# Patient Record
Sex: Female | Born: 1965 | Race: White | Hispanic: No | Marital: Married | Smoking: Never smoker
Health system: Southern US, Community
[De-identification: ages and names within clinical notes are randomized; demographics above are authoritative.]

## PROBLEM LIST (undated history)

## (undated) DIAGNOSIS — R51 Headache: Secondary | ICD-10-CM

## (undated) DIAGNOSIS — I10 Essential (primary) hypertension: Secondary | ICD-10-CM

## (undated) DIAGNOSIS — H5500 Unspecified nystagmus: Secondary | ICD-10-CM

## (undated) DIAGNOSIS — F329 Major depressive disorder, single episode, unspecified: Secondary | ICD-10-CM

## (undated) DIAGNOSIS — F32A Depression, unspecified: Secondary | ICD-10-CM

## (undated) DIAGNOSIS — T7840XA Allergy, unspecified, initial encounter: Secondary | ICD-10-CM

## (undated) DIAGNOSIS — G43909 Migraine, unspecified, not intractable, without status migrainosus: Secondary | ICD-10-CM

## (undated) DIAGNOSIS — F419 Anxiety disorder, unspecified: Secondary | ICD-10-CM

## (undated) DIAGNOSIS — Z8489 Family history of other specified conditions: Secondary | ICD-10-CM

## (undated) DIAGNOSIS — H5440 Blindness, one eye, unspecified eye: Secondary | ICD-10-CM

## (undated) DIAGNOSIS — K8020 Calculus of gallbladder without cholecystitis without obstruction: Secondary | ICD-10-CM

## (undated) DIAGNOSIS — Z8632 Personal history of gestational diabetes: Secondary | ICD-10-CM

## (undated) HISTORY — DX: Anxiety disorder, unspecified: F41.9

## (undated) HISTORY — DX: Headache: R51

## (undated) HISTORY — DX: Unspecified nystagmus: H55.00

## (undated) HISTORY — DX: Essential (primary) hypertension: I10

## (undated) HISTORY — DX: Depression, unspecified: F32.A

## (undated) HISTORY — DX: Allergy, unspecified, initial encounter: T78.40XA

## (undated) HISTORY — DX: Major depressive disorder, single episode, unspecified: F32.9

---
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
## 1993-05-23 HISTORY — PX: CYST REMOVAL LEG: SHX6280

## 1999-08-23 ENCOUNTER — Other Ambulatory Visit: Admission: RE | Admit: 1999-08-23 | Discharge: 1999-08-23 | Payer: Self-pay | Admitting: Gynecology

## 2003-12-31 ENCOUNTER — Ambulatory Visit (HOSPITAL_COMMUNITY): Admission: RE | Admit: 2003-12-31 | Discharge: 2003-12-31 | Payer: Self-pay | Admitting: Gynecology

## 2004-08-20 ENCOUNTER — Ambulatory Visit: Payer: Self-pay | Admitting: Family Medicine

## 2004-12-15 ENCOUNTER — Ambulatory Visit: Payer: Self-pay | Admitting: Family Medicine

## 2005-07-01 ENCOUNTER — Ambulatory Visit: Payer: Self-pay | Admitting: Family Medicine

## 2005-08-24 ENCOUNTER — Ambulatory Visit: Payer: Self-pay | Admitting: Family Medicine

## 2005-09-05 ENCOUNTER — Ambulatory Visit: Payer: Self-pay | Admitting: Pulmonary Disease

## 2005-09-16 ENCOUNTER — Ambulatory Visit: Payer: Self-pay | Admitting: Pulmonary Disease

## 2006-07-07 ENCOUNTER — Ambulatory Visit: Payer: Self-pay | Admitting: Family Medicine

## 2006-07-18 ENCOUNTER — Ambulatory Visit: Payer: Self-pay | Admitting: Family Medicine

## 2006-07-18 LAB — CONVERTED CEMR LAB
ALT: 18 units/L (ref 0–40)
Albumin: 3.9 g/dL (ref 3.5–5.2)
Alkaline Phosphatase: 63 units/L (ref 39–117)
BUN: 10 mg/dL (ref 6–23)
Basophils Relative: 0.6 % (ref 0.0–1.0)
CO2: 30 meq/L (ref 19–32)
Calcium: 9.3 mg/dL (ref 8.4–10.5)
Cholesterol: 207 mg/dL (ref 0–200)
Creatinine, Ser: 0.6 mg/dL (ref 0.4–1.2)
GFR calc Af Amer: 141 mL/min
HDL: 58.5 mg/dL (ref >39.0)
Monocytes Relative: 6.8 % (ref 3.0–11.0)
Neutro Abs: 3.2 10*3/uL (ref 1.4–7.7)
Platelets: 197 10*3/uL (ref 150–400)
RDW: 12.5 % (ref 11.5–14.6)
Total CHOL/HDL Ratio: 3.5
Total Protein: 6.9 g/dL (ref 6.0–8.3)
VLDL: 22 mg/dL (ref 0–40)

## 2006-07-24 ENCOUNTER — Ambulatory Visit: Payer: Self-pay | Admitting: Family Medicine

## 2007-06-08 ENCOUNTER — Ambulatory Visit: Payer: Self-pay | Admitting: Family Medicine

## 2007-06-08 DIAGNOSIS — R519 Headache, unspecified: Secondary | ICD-10-CM | POA: Insufficient documentation

## 2007-06-08 DIAGNOSIS — G43909 Migraine, unspecified, not intractable, without status migrainosus: Secondary | ICD-10-CM

## 2007-06-08 DIAGNOSIS — R51 Headache: Secondary | ICD-10-CM

## 2007-06-08 DIAGNOSIS — M542 Cervicalgia: Secondary | ICD-10-CM

## 2007-06-08 DIAGNOSIS — F329 Major depressive disorder, single episode, unspecified: Secondary | ICD-10-CM

## 2007-06-21 ENCOUNTER — Telehealth: Payer: Self-pay | Admitting: Family Medicine

## 2007-06-27 ENCOUNTER — Ambulatory Visit: Payer: Self-pay | Admitting: Family Medicine

## 2007-07-02 ENCOUNTER — Telehealth: Payer: Self-pay | Admitting: Family Medicine

## 2007-08-06 ENCOUNTER — Telehealth: Payer: Self-pay | Admitting: Family Medicine

## 2007-08-08 ENCOUNTER — Ambulatory Visit: Payer: Self-pay | Admitting: Family Medicine

## 2007-10-18 ENCOUNTER — Ambulatory Visit: Payer: Self-pay | Admitting: Family Medicine

## 2007-12-03 ENCOUNTER — Ambulatory Visit: Payer: Self-pay | Admitting: Family Medicine

## 2007-12-06 ENCOUNTER — Other Ambulatory Visit: Admission: RE | Admit: 2007-12-06 | Discharge: 2007-12-06 | Payer: Self-pay | Admitting: Gynecology

## 2007-12-20 ENCOUNTER — Ambulatory Visit (HOSPITAL_COMMUNITY): Admission: RE | Admit: 2007-12-20 | Discharge: 2007-12-20 | Payer: Self-pay | Admitting: Gynecology

## 2007-12-28 ENCOUNTER — Encounter: Admission: RE | Admit: 2007-12-28 | Discharge: 2007-12-28 | Payer: Self-pay | Admitting: Gynecology

## 2008-01-18 ENCOUNTER — Ambulatory Visit: Payer: Self-pay | Admitting: Family Medicine

## 2008-12-19 ENCOUNTER — Ambulatory Visit: Payer: Self-pay | Admitting: Family Medicine

## 2009-01-07 ENCOUNTER — Ambulatory Visit: Payer: Self-pay | Admitting: Licensed Clinical Social Worker

## 2009-01-13 ENCOUNTER — Ambulatory Visit: Payer: Self-pay | Admitting: Family Medicine

## 2009-01-15 ENCOUNTER — Ambulatory Visit: Payer: Self-pay | Admitting: Licensed Clinical Social Worker

## 2009-01-23 ENCOUNTER — Ambulatory Visit: Payer: Self-pay | Admitting: Licensed Clinical Social Worker

## 2009-02-19 ENCOUNTER — Ambulatory Visit: Payer: Self-pay | Admitting: Family Medicine

## 2009-02-27 ENCOUNTER — Ambulatory Visit: Payer: Self-pay | Admitting: Family Medicine

## 2009-02-27 DIAGNOSIS — M546 Pain in thoracic spine: Secondary | ICD-10-CM

## 2009-02-27 LAB — CONVERTED CEMR LAB: Rapid Strep: NEGATIVE

## 2010-02-24 ENCOUNTER — Ambulatory Visit: Payer: Self-pay | Admitting: Family Medicine

## 2010-05-27 ENCOUNTER — Ambulatory Visit
Admission: RE | Admit: 2010-05-27 | Discharge: 2010-05-27 | Payer: Self-pay | Source: Home / Self Care | Attending: Family Medicine | Admitting: Family Medicine

## 2010-05-27 ENCOUNTER — Other Ambulatory Visit: Payer: Self-pay | Admitting: Family Medicine

## 2010-05-27 DIAGNOSIS — O99810 Abnormal glucose complicating pregnancy: Secondary | ICD-10-CM | POA: Insufficient documentation

## 2010-05-27 LAB — CBC WITH DIFFERENTIAL/PLATELET
Basophils Absolute: 0 10*3/uL (ref 0.0–0.1)
Basophils Relative: 0.5 % (ref 0.0–3.0)
Eosinophils Absolute: 0.2 10*3/uL (ref 0.0–0.7)
Eosinophils Relative: 2.5 % (ref 0.0–5.0)
HCT: 36.6 % (ref 36.0–46.0)
Hemoglobin: 12.3 g/dL (ref 12.0–15.0)
Lymphocytes Relative: 29.8 % (ref 12.0–46.0)
Lymphs Abs: 1.8 10*3/uL (ref 0.7–4.0)
MCHC: 33.7 g/dL (ref 30.0–36.0)
MCV: 86.4 fl (ref 78.0–100.0)
Monocytes Absolute: 0.4 10*3/uL (ref 0.1–1.0)
Monocytes Relative: 6.5 % (ref 3.0–12.0)
Neutro Abs: 3.8 10*3/uL (ref 1.4–7.7)
Neutrophils Relative %: 60.7 % (ref 43.0–77.0)
Platelets: 218 10*3/uL (ref 150.0–400.0)
RBC: 4.24 Mil/uL (ref 3.87–5.11)
RDW: 14.1 % (ref 11.5–14.6)
WBC: 6.2 10*3/uL (ref 4.5–10.5)

## 2010-05-27 LAB — HEMOGLOBIN A1C: Hgb A1c MFr Bld: 5.5 % (ref 4.6–6.5)

## 2010-05-28 LAB — HEPATIC FUNCTION PANEL
ALT: 18 U/L (ref 0–35)
AST: 18 U/L (ref 0–37)
Albumin: 4.2 g/dL (ref 3.5–5.2)
Alkaline Phosphatase: 69 U/L (ref 39–117)
Bilirubin, Direct: 0 mg/dL (ref 0.0–0.3)
Total Bilirubin: 0.5 mg/dL (ref 0.3–1.2)
Total Protein: 7 g/dL (ref 6.0–8.3)

## 2010-05-28 LAB — BASIC METABOLIC PANEL
BUN: 12 mg/dL (ref 6–23)
CO2: 30 mEq/L (ref 19–32)
Calcium: 9.4 mg/dL (ref 8.4–10.5)
Chloride: 102 mEq/L (ref 96–112)
Creatinine, Ser: 0.7 mg/dL (ref 0.4–1.2)
GFR: 97.08 mL/min (ref >60.00)
Glucose, Bld: 75 mg/dL (ref 70–99)
Potassium: 4.1 mEq/L (ref 3.5–5.1)
Sodium: 139 mEq/L (ref 135–145)

## 2010-05-28 LAB — TSH: TSH: 1.12 u[IU]/mL (ref 0.35–5.50)

## 2010-06-14 ENCOUNTER — Encounter: Payer: Self-pay | Admitting: Gynecology

## 2010-06-18 ENCOUNTER — Telehealth: Payer: Self-pay | Admitting: Family Medicine

## 2010-06-21 ENCOUNTER — Ambulatory Visit: Admit: 2010-06-21 | Payer: Self-pay | Admitting: Family Medicine

## 2010-06-22 NOTE — Assessment & Plan Note (Signed)
Summary: FLU SHOT//ALP  Nurse Visit   Review of Systems       Flu Vaccine Consent Questions     Do you have a history of severe allergic reactions to this vaccine? no    Any prior history of allergic reactions to egg and/or gelatin? no    Do you have a sensitivity to the preservative Thimersol? no    Do you have a past history of Guillan-Barre Syndrome? no    Do you currently have an acute febrile illness? no    Have you ever had a severe reaction to latex? no    Vaccine information given and explained to patient? yes    Are you currently pregnant? no    Lot Number:[id number]   Exp Date:11/20/2010   Site Given  Left Deltoid IM Pura Spice, RN  February 24, 2010 3:58 PM    Allergies: No Known Drug Allergies  Orders Added: 1)  Admin 1st Vaccine [90471] 2)  Flu Vaccine 108yrs + [78469]

## 2010-06-24 NOTE — Progress Notes (Addendum)
Summary: REFILL REQUEST---- (please see notation)  Phone Note Refill Request Message from:  Patient on June 18, 2010 4:39 PM  Refills Requested: Medication #1:  WELLBUTRIN XL 300 MG XR24H-TAB once daily   Notes: [facility] [place]...Marland KitchenMarland Kitchen Pt adv that her online pharmacy is not able to send it any time soon and she is out of med...Marland KitchenMarland KitchenAlso pt needs to have Rx faxed to [facility], pt adv she doesn't have original copy of RX and [facility] will not take anything by fax except the original Rx being faxed by the doctor's office....won't accept fax from pt.     Initial call taken by: Debbra Riding,  June 18, 2010 4:40 PM  Follow-up for Phone Call        please fax the written rx to [facility]. Call in #30 with 2 rf to local pharmacy Follow-up by: Nelwyn Salisbury MD,  June 18, 2010 5:23 PM  Additional Follow-up for Phone Call Additional follow up Details #1::        [facility] other [facility]ed to [facility]  pt aware  Additional Follow-up by: Pura Spice, RN,  June 18, 2010 5:25 PM    Prescriptions: WELLBUTRIN XL 300 MG XR24H-TAB (BUPROPION HCL) once daily  #30 x 2   Entered by:   Pura Spice, RN   Authorized by:   Nelwyn Salisbury MD   Signed by:   Pura Spice, RN on 06/18/2010   Method used:   Electronically to        [facility]. [phone]* (retail)       [phone]       [place], [place]  [address]       Ph: [phone]       Fax: [phone]   RxID:   [id number] WELLBUTRIN XL 300 MG XR24H-TAB (BUPROPION HCL) once daily  #90 x 3   Entered and Authorized by:   Nelwyn Salisbury MD   Signed by:   Nelwyn Salisbury MD on 06/18/2010   Method used:   Print then Give to Patient   RxID:   [id number]   Appended Document: REFILL REQUEST---- (please see notation) pt aware to pick rx up

## 2010-06-24 NOTE — Assessment & Plan Note (Signed)
Summary: roa---will fast//ccm   Vital Signs:  Patient profile:   45 year old female Height:      64 inches Weight:      103 pounds O2 Sat:      99 % on Room air Temp:     97.9 degrees F oral Pulse rate:   103 / minute BP sitting:   138 / 92  (left arm)  Vitals Entered By: Jeremy Johann CMA (May 27, 2010 10:05 AM)  O2 Flow:  Room air CC: f/u not fasting   History of Present Illness: Here to follow up on depression and migraines. She has been doing extremely well on her current meds. Her moods are stable, and she wants to stay on them. She also describes being very thirsty all the time and having foul smelling concentrated urine lately. She does have a hx of gestational diabetes.   Current Medications (verified): 1)  Axert 12.5 Mg Tabs (Almotriptan Malate) .Marland Kitchen.. 1 By Mouth At Onset of Headache 2)  Wellbutrin Xl 300 Mg Xr24h-Tab (Bupropion Hcl) .... Once Daily 3)  Venlafaxine Hcl 150 Mg Xr24h-Cap (Venlafaxine Hcl) .Marland Kitchen.. 1 By Mouth Once Daily  Allergies (verified): No Known Drug Allergies  Past History:  Past Medical History: Depression Headache gestational diabetes, resolved  Past Surgical History: Reviewed history from 06/08/2007 and no changes required. Denies surgical history  Family History: Reviewed history and no changes required. Family History of Colon CA 1st degree relative <60 Family History Diabetes 1st degree relative Family History Hypertension Family History Lung cancer GF died of pancreatic cancer mother has rheumatoid arthritis Family History Osteoporosis  Review of Systems  The patient denies anorexia, fever, weight loss, weight gain, vision loss, decreased hearing, hoarseness, chest pain, syncope, dyspnea on exertion, peripheral edema, prolonged cough, headaches, hemoptysis, abdominal pain, melena, hematochezia, severe indigestion/heartburn, hematuria, incontinence, genital sores, muscle weakness, suspicious skin lesions, transient blindness,  difficulty walking, depression, unusual weight change, abnormal bleeding, enlarged lymph nodes, angioedema, breast masses, and testicular masses.    Physical Exam  General:  Well-developed,well-nourished,in no acute distress; alert,appropriate and cooperative throughout examination Neck:  No deformities, masses, or tenderness noted. Lungs:  Normal respiratory effort, chest expands symmetrically. Lungs are clear to auscultation, no crackles or wheezes. Heart:  Normal rate and regular rhythm. S1 and S2 normal without gallop, murmur, click, rub or other extra sounds. Psych:  Cognition and judgment appear intact. Alert and cooperative with normal attention span and concentration. No apparent delusions, illusions, hallucinations   Impression & Recommendations:  Problem # 1:  DEPRESSION (ICD-311)  Her updated medication list for this problem includes:    Wellbutrin Xl 300 Mg Xr24h-tab (Bupropion hcl) ..... Once daily    Venlafaxine Hcl 150 Mg Xr24h-cap (Venlafaxine hcl) .Marland Kitchen... 1 by mouth once daily  Problem # 2:  HEADACHE (ICD-784.0)  Her updated medication list for this problem includes:    Axert 12.5 Mg Tabs (Almotriptan malate) .Marland Kitchen... As needed  Problem # 3:  GESTATIONAL DIABETES (ICD-648.80)  Orders: UA Dipstick w/o Micro (automated)  (81003) Venipuncture (21308) TLB-BMP (Basic Metabolic Panel-BMET) (80048-METABOL) TLB-CBC Platelet - w/Differential (85025-CBCD) TLB-Hepatic/Liver Function Pnl (80076-HEPATIC) TLB-TSH (Thyroid Stimulating Hormone) (84443-TSH) TLB-A1C / Hgb A1C (Glycohemoglobin) (83036-A1C)  Complete Medication List: 1)  Axert 12.5 Mg Tabs (Almotriptan malate) .... As needed 2)  Wellbutrin Xl 300 Mg Xr24h-tab (Bupropion hcl) .... Once daily 3)  Venlafaxine Hcl 150 Mg Xr24h-cap (Venlafaxine hcl) .Marland Kitchen.. 1 by mouth once daily  Patient Instructions: 1)  get labs today Prescriptions: VENLAFAXINE  HCL 150 MG XR24H-CAP (VENLAFAXINE HCL) 1 by mouth once daily  #90 x 3    Entered and Authorized by:   Nelwyn Salisbury MD   Signed by:   Nelwyn Salisbury MD on 05/27/2010   Method used:   Print then Give to Patient   RxID:   [id number] WELLBUTRIN XL 300 MG XR24H-TAB (BUPROPION HCL) once daily  #90 x 3   Entered and Authorized by:   Nelwyn Salisbury MD   Signed by:   Nelwyn Salisbury MD on 05/27/2010   Method used:   Print then Give to Patient   RxID:   [id number] AXERT 12.5 MG TABS (ALMOTRIPTAN MALATE) as needed  #36 x 3   Entered and Authorized by:   Nelwyn Salisbury MD   Signed by:   Nelwyn Salisbury MD on 05/27/2010   Method used:   Print then Give to Patient   RxID:   [id number]    Orders Added: 1)  Est. Patient Level IV [01027] 2)  UA Dipstick w/o Micro (automated)  [81003] 3)  Venipuncture [25366] 4)  TLB-BMP (Basic Metabolic Panel-BMET) [80048-METABOL] 5)  TLB-CBC Platelet - w/Differential [85025-CBCD] 6)  TLB-Hepatic/Liver Function Pnl [80076-HEPATIC] 7)  TLB-TSH (Thyroid Stimulating Hormone) [84443-TSH] 8)  TLB-A1C / Hgb A1C (Glycohemoglobin) [83036-A1C]  Appended Document: Orders Update    Clinical Lists Changes  Orders: Added new Service order of Specimen Handling (44034) - Signed      Appended Document: roa---will fast//ccm  Laboratory Results   Urine Tests    Routine Urinalysis   Color: yellow Appearance: Clear Glucose: negative   (Normal Range: Negative) Bilirubin: negative   (Normal Range: Negative) Ketone: negative   (Normal Range: Negative) Spec. Gravity: 1.010   (Normal Range: 1.003-1.035) Blood: 1+   (Normal Range: Negative) pH: 7.0   (Normal Range: 5.0-8.0) Protein: negative   (Normal Range: Negative) Urobilinogen: 0.2   (Normal Range: 0-1) Nitrite: negative   (Normal Range: Negative) Leukocyte Esterace: negative   (Normal Range: Negative)    Comments: Rita Ohara  May 27, 2010 3:29 PM

## 2010-06-25 ENCOUNTER — Telehealth: Payer: Self-pay | Admitting: Family Medicine

## 2010-06-25 ENCOUNTER — Other Ambulatory Visit: Payer: Self-pay

## 2010-06-25 DIAGNOSIS — F329 Major depressive disorder, single episode, unspecified: Secondary | ICD-10-CM

## 2010-06-25 MED ORDER — BUPROPION HCL ER (XL) 300 MG PO TB24: 300.0000 mg | ORAL_TABLET | ORAL | Status: DC

## 2010-06-25 NOTE — Telephone Encounter (Signed)
Med submitted to [facility]  Pt aware

## 2010-06-25 NOTE — Telephone Encounter (Signed)
Pt has an issue with a script for Buprion. Pt is req that the script be faxed to [facility] mail order.

## 2010-08-19 ENCOUNTER — Other Ambulatory Visit: Payer: Self-pay | Admitting: Family Medicine

## 2010-08-26 ENCOUNTER — Encounter: Payer: Self-pay | Admitting: Family Medicine

## 2010-08-30 ENCOUNTER — Ambulatory Visit (INDEPENDENT_AMBULATORY_CARE_PROVIDER_SITE_OTHER): Payer: BC Managed Care – PPO | Admitting: Family Medicine

## 2010-08-30 ENCOUNTER — Encounter: Payer: Self-pay | Admitting: Family Medicine

## 2010-08-30 VITALS — BP 136/80 | HR 110 | Ht 64.0 in | Wt 168.0 lb

## 2010-08-30 DIAGNOSIS — Z0000 Encounter for general adult medical examination without abnormal findings: Secondary | ICD-10-CM

## 2010-08-30 NOTE — Progress Notes (Signed)
  Subjective:    Patient ID: Kathy Lamb, female    DOB: 03-Aug-1965, 45 y.o.   MRN: [id number]  HPI 45 yr old female for a cpx. She feels great and has no concerns.    Review of Systems  Constitutional: Negative.   HENT: Negative.   Eyes: Negative.   Respiratory: Negative.   Cardiovascular: Negative.   Gastrointestinal: Negative.   Genitourinary: Negative for dysuria, urgency, frequency, hematuria, flank pain, decreased urine volume, enuresis, difficulty urinating, pelvic pain and dyspareunia.  Musculoskeletal: Negative.   Skin: Negative.   Neurological: Negative.   Hematological: Negative.   Psychiatric/Behavioral: Negative.        Objective:   Physical Exam  Constitutional: She is oriented to person, place, and time. She appears well-developed and well-nourished. No distress.  HENT:  Head: Normocephalic and atraumatic.  Right Ear: External ear normal.  Left Ear: External ear normal.  Nose: Nose normal.  Mouth/Throat: Oropharynx is clear and moist. No oropharyngeal exudate.  Eyes: Conjunctivae and EOM are normal. Pupils are equal, round, and reactive to light. No scleral icterus.  Neck: Normal range of motion. Neck supple. No JVD present. No thyromegaly present.  Cardiovascular: Normal rate, regular rhythm, normal heart sounds and intact distal pulses.  Exam reveals no gallop and no friction rub.   No murmur heard. Pulmonary/Chest: Effort normal and breath sounds normal. No respiratory distress. She has no wheezes. She has no rales. She exhibits no tenderness.  Abdominal: Soft. Bowel sounds are normal. She exhibits no distension and no mass. There is no tenderness. There is no rebound and no guarding.  Musculoskeletal: Normal range of motion. She exhibits no edema and no tenderness.  Lymphadenopathy:    She has no cervical adenopathy.  Neurological: She is alert and oriented to person, place, and time. She has normal reflexes. No cranial nerve deficit. She exhibits normal  muscle tone. Coordination normal.  Skin: Skin is warm and dry. No rash noted. No erythema.  Psychiatric: She has a normal mood and affect. Her behavior is normal. Judgment and thought content normal.          Assessment & Plan:  Get more exercise.

## 2010-09-23 ENCOUNTER — Telehealth: Payer: Self-pay | Admitting: Family Medicine

## 2010-09-23 ENCOUNTER — Ambulatory Visit: Payer: BC Managed Care – PPO | Admitting: Family Medicine

## 2010-09-23 NOTE — Telephone Encounter (Signed)
Was able to reach Pt on mobile. She is coming in tomorrow at 09:00am for PPD test.

## 2010-09-24 ENCOUNTER — Ambulatory Visit: Payer: BC Managed Care – PPO | Admitting: Family Medicine

## 2010-09-24 DIAGNOSIS — Z111 Encounter for screening for respiratory tuberculosis: Secondary | ICD-10-CM

## 2010-09-24 DIAGNOSIS — Z0000 Encounter for general adult medical examination without abnormal findings: Secondary | ICD-10-CM

## 2010-09-24 MED ORDER — TUBERCULIN PPD 5 UNIT/0.1ML ID SOLN
5.0000 [IU] | Freq: Once | INTRADERMAL | Status: AC
  Administered 2010-09-24: 5 [IU] via INTRADERMAL

## 2010-09-28 ENCOUNTER — Ambulatory Visit (INDEPENDENT_AMBULATORY_CARE_PROVIDER_SITE_OTHER): Payer: BC Managed Care – PPO | Admitting: Family Medicine

## 2010-09-28 DIAGNOSIS — Z111 Encounter for screening for respiratory tuberculosis: Secondary | ICD-10-CM

## 2010-09-30 ENCOUNTER — Encounter: Payer: Self-pay | Admitting: *Deleted

## 2010-10-08 NOTE — Assessment & Plan Note (Signed)
[facility] OFFICE NOTE   Kathy Kathy Lamb, Kathy Lamb Kathy Kathy [name]                       MRN:          [id number]  DATE:07/24/2006                            DOB:          [birth year]    This is a 45 year old woman here for a nongynecological physical  examination.  In general, she is doing quite well.  I saw her on  February 15 with some increasing problems from anxiety.  We doubled the  strength on her Celexa and she now feels it is working quite well.  Her  headaches remain fairly stable.  For details of her past medical  history, family history, social history, habits, etc. I refer you to our  introductory note with her dated Oct 07, 2002.  She does see Dr.  Audie Lamb on a regular basis for gynecology exams.   ALLERGIES:  None.   CURRENT MEDICATIONS:  1. Celexa 40 mg per day.  2. Allegra D 24-Hour once daily as needed.  3. Axert 12.5 mg as needed for migraine headaches.   SUBJECTIVE:  VITAL SIGNS:  Height 5 feet 5 inches, weight 167, BP  114/88, pulse 76 and regular.  GENERAL:  She is a little overweight.  She wears glasses.  SKIN:  Clear.  HEENT:  Eyes are clear, ears are clear, pharynx clear.  NECK:  Supple without lymphadenopathy or masses.  LUNGS:  Clear.  CARDIAC:  Rate and rhythm are regular without gallops, murmurs or rubs.  Distal pulses are full.  ABDOMEN:  Soft, normal bowel sounds, nontender, no masses.  EXTREMITIES:  No clubbing, cyanosis, or edema.  NEUROLOGIC:  Grossly intact.   She was here for fasting labs on February 26.  These were within normal  limits except for a lipid panel.  HDL was excellent at 58 but LDL was  slightly high at 409.   ASSESSMENT AND PLAN:  1. Complete physical.  We talked about increasing exercise and losing      weight.  2. Mild hyperlipidemia.  We talked about dietary changes she could      make.  3. Anxiety, now well controlled.  4. Migraine headache, stable.     Kathy Kathy [name]. Kathy Ridges, MD  Electronically Signed   SAF/MedQ  DD: 07/24/2006  DT: 07/24/2006  Job #: [id number]

## 2010-12-17 ENCOUNTER — Telehealth: Payer: Self-pay | Admitting: Family Medicine

## 2010-12-17 NOTE — Telephone Encounter (Signed)
Please fax

## 2010-12-17 NOTE — Telephone Encounter (Signed)
Script was called in to [facility] and left message for pt.

## 2010-12-17 NOTE — Telephone Encounter (Signed)
Refill request from [facility] for Venlafaxine HCL ER 150 mg, pt last here on 08/30/10 and script last filled on 08/20/10.

## 2011-03-29 ENCOUNTER — Other Ambulatory Visit: Payer: Self-pay | Admitting: Family Medicine

## 2011-03-30 NOTE — Telephone Encounter (Signed)
Pt is out °

## 2011-03-30 NOTE — Telephone Encounter (Signed)
Pt last seen 08/30/10. Pls advise.

## 2011-06-04 ENCOUNTER — Other Ambulatory Visit: Payer: Self-pay | Admitting: Family Medicine

## 2011-06-07 ENCOUNTER — Encounter: Payer: Self-pay | Admitting: Family Medicine

## 2011-06-07 ENCOUNTER — Ambulatory Visit (INDEPENDENT_AMBULATORY_CARE_PROVIDER_SITE_OTHER): Payer: BC Managed Care – PPO | Admitting: Family Medicine

## 2011-06-07 VITALS — BP 138/96 | HR 101 | Temp 98.5°F | Wt 164.0 lb

## 2011-06-07 DIAGNOSIS — M79673 Pain in unspecified foot: Secondary | ICD-10-CM

## 2011-06-07 DIAGNOSIS — M79609 Pain in unspecified limb: Secondary | ICD-10-CM

## 2011-06-07 DIAGNOSIS — J329 Chronic sinusitis, unspecified: Secondary | ICD-10-CM

## 2011-06-07 MED ORDER — AZITHROMYCIN 250 MG PO TABS: ORAL_TABLET | ORAL | Status: AC

## 2011-06-07 NOTE — Progress Notes (Signed)
  Subjective:    Patient ID: Kathy Lamb, female    DOB: 05/27/1965, 46 y.o.   MRN: [id number]  HPI Here for 2 reasons. First she has had pain in the right foot for 2 weeks. This is in the lateral foot just below the ankle. No swelling. No trauma. 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 Ice and Advil help a little. Also for 5 days she has had sinus pressure, PND, ST, an a dry cough.    Review of Systems  Constitutional: Negative.   HENT: Positive for congestion, postnasal drip and sinus pressure.   Eyes: Negative.   Respiratory: Positive for cough.   Musculoskeletal: Positive for arthralgias.       Objective:   Physical Exam  Constitutional: She appears well-developed and well-nourished.  HENT:  Right Ear: External ear normal.  Left Ear: External ear normal.  Nose: Nose normal.  Mouth/Throat: Oropharynx is clear and moist. No oropharyngeal exudate.  Eyes: Conjunctivae are normal.  Pulmonary/Chest: Effort normal and breath sounds normal.  Musculoskeletal:       Tender on the right lateral foot, no swelling or ecchymosis. Her arches are a bit flat  Lymphadenopathy:    She has no cervical adenopathy.          Assessment & Plan:  Wear athletic shoes to work and even around the house, and place gel arch supports inside the shoes. No walking barefoot. Her flat arches are putting stress on the mid foot ligaments. Try Aleve 2 tabs bid. Use a Zapck for the sinusitis.

## 2011-07-19 ENCOUNTER — Encounter: Payer: Self-pay | Admitting: Family Medicine

## 2011-07-19 ENCOUNTER — Ambulatory Visit (INDEPENDENT_AMBULATORY_CARE_PROVIDER_SITE_OTHER): Payer: BC Managed Care – PPO | Admitting: Family Medicine

## 2011-07-19 VITALS — BP 124/78 | HR 93 | Temp 98.2°F | Wt 166.0 lb

## 2011-07-19 DIAGNOSIS — F341 Dysthymic disorder: Secondary | ICD-10-CM

## 2011-07-19 DIAGNOSIS — Z23 Encounter for immunization: Secondary | ICD-10-CM

## 2011-07-19 DIAGNOSIS — F418 Other specified anxiety disorders: Secondary | ICD-10-CM

## 2011-07-19 MED ORDER — VENLAFAXINE HCL ER 75 MG PO CP24: 75.0000 mg | ORAL_CAPSULE | Freq: Every day | ORAL | Status: DC

## 2011-07-19 NOTE — Progress Notes (Signed)
  Subjective:    Patient ID: Kathy Lamb, female    DOB: 1966/05/14, 46 y.o.   MRN: [id number]  HPI Here to follow up on anxiety and depression. She has been doing quite well, and her moods have been excellent. She has been on Wellbutrin and Effexor for well over a year, and these have been very helpful for her. Now however she complains of some chronic fatigue and some loss of libido. She sleeps well. She gets exercise.    Review of Systems  Constitutional: Negative.   Neurological: Negative.   Psychiatric/Behavioral: Positive for decreased concentration. Negative for hallucinations, behavioral problems, confusion, dysphoric mood and agitation. The patient is nervous/anxious.        Objective:   Physical Exam  Constitutional: She appears well-developed and well-nourished.  Neurological: She is alert.  Psychiatric: She has a normal mood and affect. Her behavior is normal. Judgment and thought content normal.          Assessment & Plan:  She is doing very well now, and I do think we can wean her off some meds. We will start with Effexor, and plan to leave her on Wellbutrin for the time being. Decrease Effexor to 75 mg a day for 4 weeks and then see me.

## 2011-08-16 ENCOUNTER — Other Ambulatory Visit: Payer: Self-pay | Admitting: Family Medicine

## 2011-08-18 ENCOUNTER — Telehealth: Payer: Self-pay | Admitting: Family Medicine

## 2011-08-18 MED ORDER — BUPROPION HCL ER (XL) 300 MG PO TB24: 300.0000 mg | ORAL_TABLET | Freq: Every day | ORAL | Status: DC

## 2011-08-18 NOTE — Telephone Encounter (Signed)
Script called in

## 2011-08-18 NOTE — Telephone Encounter (Signed)
Call in one year supply  

## 2011-08-18 NOTE — Telephone Encounter (Signed)
Refill request for Bupropion HCL XL 300 mg take 1 po qd and pt last here on 07/19/11.

## 2011-09-20 ENCOUNTER — Other Ambulatory Visit: Payer: Self-pay | Admitting: Family Medicine

## 2011-12-18 ENCOUNTER — Other Ambulatory Visit: Payer: Self-pay | Admitting: Family Medicine

## 2012-01-24 ENCOUNTER — Other Ambulatory Visit: Payer: Self-pay | Admitting: Family Medicine

## 2012-01-31 ENCOUNTER — Telehealth: Payer: Self-pay | Admitting: Family Medicine

## 2012-01-31 NOTE — Telephone Encounter (Signed)
[facility] called re: AXERT 12.5 MG tablet. This med is not covered by pts insurance. Would req a review or PA. Pharmacist said that the following meds would be an alternative : Sumatriptan, Naratriptan, Rizatriptan, Zolmitriptan and Relpax. Pls let [facility] know what Dr Clent Ridges recommends.

## 2012-02-01 NOTE — Telephone Encounter (Signed)
Ask the patient what other brand she prefers (she may have used them before)

## 2012-02-02 NOTE — Telephone Encounter (Signed)
Pt does not want Maxalt, it did not work in the past. She is willing to try any of the other medications.

## 2012-02-02 NOTE — Telephone Encounter (Signed)
Call in Sumatriptan 100 mg to use prn migraines, #30 with 3 rf

## 2012-02-03 ENCOUNTER — Ambulatory Visit (INDEPENDENT_AMBULATORY_CARE_PROVIDER_SITE_OTHER): Payer: BC Managed Care – PPO | Admitting: Family Medicine

## 2012-02-03 ENCOUNTER — Encounter: Payer: Self-pay | Admitting: Family Medicine

## 2012-02-03 VITALS — BP 124/88 | Temp 98.0°F | Wt 173.0 lb

## 2012-02-03 DIAGNOSIS — H9190 Unspecified hearing loss, unspecified ear: Secondary | ICD-10-CM

## 2012-02-03 DIAGNOSIS — N390 Urinary tract infection, site not specified: Secondary | ICD-10-CM

## 2012-02-03 LAB — POCT URINALYSIS DIPSTICK
Bilirubin, UA: NEGATIVE
Glucose, UA: NEGATIVE
Nitrite, UA: NEGATIVE

## 2012-02-03 MED ORDER — CIPROFLOXACIN HCL 500 MG PO TABS: 500.0000 mg | ORAL_TABLET | Freq: Two times a day (BID) | ORAL | Status: AC

## 2012-02-03 MED ORDER — SUMATRIPTAN SUCCINATE 100 MG PO TABS: 100.0000 mg | ORAL_TABLET | ORAL | Status: DC | PRN

## 2012-02-03 NOTE — Telephone Encounter (Signed)
I sent script e-scribe, also pt is on schedule for today.

## 2012-02-03 NOTE — Progress Notes (Signed)
  Subjective:    Patient ID: Kathy Lamb, female    DOB: [birth year], 46 y.o.   MRN: [id number]  HPI Here for 2 things. First she has noticed a gradual loss of hearing in both ears for the past year, and now even her family has noticed this. She often asks people to repeat what they say to her. No pain or ringing or dizziness. Also for 5 days she has had some urinary burning and urgency. No nausea or fever.    Review of Systems  Constitutional: Negative.   HENT: Positive for hearing loss. Negative for ear pain, congestion, postnasal drip, tinnitus and ear discharge.   Eyes: Negative.   Respiratory: Negative.   Genitourinary: Positive for dysuria and frequency. Negative for flank pain and pelvic pain.       Objective:   Physical Exam  Constitutional: She appears well-developed and well-nourished.  HENT:  Right Ear: External ear normal.  Left Ear: External ear normal.  Nose: Nose normal.  Mouth/Throat: Oropharynx is clear and moist.  Neck: Neck supple. No thyromegaly present.  Abdominal: Soft. Bowel sounds are normal. She exhibits no distension and no mass. There is no tenderness. There is no rebound and no guarding.  Lymphadenopathy:    She has no cervical adenopathy.          Assessment & Plan:  Refer to ENT for the hearing loss. Use Cipro for the UTI

## 2012-02-06 LAB — URINE CULTURE: Colony Count: >=100000

## 2012-02-21 ENCOUNTER — Other Ambulatory Visit: Payer: Self-pay | Admitting: Family Medicine

## 2012-08-04 ENCOUNTER — Other Ambulatory Visit: Payer: Self-pay | Admitting: Family Medicine

## 2012-09-17 ENCOUNTER — Ambulatory Visit (INDEPENDENT_AMBULATORY_CARE_PROVIDER_SITE_OTHER): Payer: BC Managed Care – PPO | Admitting: Family Medicine

## 2012-09-17 ENCOUNTER — Encounter: Payer: Self-pay | Admitting: Family Medicine

## 2012-09-17 VITALS — BP 130/86 | HR 93 | Temp 98.4°F | Wt 166.0 lb

## 2012-09-17 DIAGNOSIS — R739 Hyperglycemia, unspecified: Secondary | ICD-10-CM

## 2012-09-17 DIAGNOSIS — G43909 Migraine, unspecified, not intractable, without status migrainosus: Secondary | ICD-10-CM

## 2012-09-17 DIAGNOSIS — R7309 Other abnormal glucose: Secondary | ICD-10-CM

## 2012-09-17 DIAGNOSIS — F329 Major depressive disorder, single episode, unspecified: Secondary | ICD-10-CM

## 2012-09-17 MED ORDER — HYDROCODONE-ACETAMINOPHEN 5-325 MG PO TABS: 1.0000 | ORAL_TABLET | Freq: Four times a day (QID) | ORAL | Status: DC | PRN

## 2012-09-17 MED ORDER — TOPIRAMATE 25 MG PO TABS: 25.0000 mg | ORAL_TABLET | Freq: Every day | ORAL | Status: DC

## 2012-09-17 MED ORDER — VENLAFAXINE HCL ER 75 MG PO CP24: 75.0000 mg | ORAL_CAPSULE | Freq: Every day | ORAL | Status: DC

## 2012-09-17 MED ORDER — ALMOTRIPTAN MALATE 12.5 MG PO TABS: 12.5000 mg | ORAL_TABLET | ORAL | Status: DC | PRN

## 2012-09-17 NOTE — Progress Notes (Signed)
  Subjective:    Patient ID: Kathy Lamb, female    DOB: [birth year], 48 y.o.   MRN: [id number]  HPI Here for several things. First he migraines have been coming more frequently for the past 6 months, so that now she gets one every week. She had gotten good results in the past with Axert but her insurance would not cover this, so she tried Sumatriptan. These do not work well for her at all. She tried a few of her husband's Maxalt, but these caused unpleasant dreams. No change in her diet recently. Also she describes several months of a very dry mouth. Her last labs were done over two years ago. She has a hx of gestational diabetes. Finally she has been having more anxiety lately, so she wants to get back back on Effexor in addition to Wellbutrin. She sleeps well.    Review of Systems  Constitutional: Negative.   Respiratory: Negative.   Cardiovascular: Negative.   Neurological: Positive for headaches.  Psychiatric/Behavioral: Positive for dysphoric mood. Negative for confusion, decreased concentration and agitation. The patient is nervous/anxious.        Objective:   Physical Exam  Constitutional: She is oriented to person, place, and time. She appears well-developed and well-nourished. No distress.  Eyes: Conjunctivae are normal. Pupils are equal, round, and reactive to light.  Neurological: She is alert and oriented to person, place, and time.  Psychiatric: She has a normal mood and affect. Her behavior is normal. Thought content normal.          Assessment & Plan:  Start on Topamax nightly for HA prevention. We will taper up as needed. Get back on Effexor. Wrote for her to try Axert again. We will pursue a prior authorization if needed. Get labs today. Recheck in 4 weeks

## 2012-09-18 LAB — CBC WITH DIFFERENTIAL/PLATELET
Basophils Relative: 0.9 % (ref 0.0–3.0)
Eosinophils Relative: 2.3 % (ref 0.0–5.0)
HCT: 34.5 % — ABNORMAL LOW (ref 36.0–46.0)
Lymphs Abs: 1.9 10*3/uL (ref 0.7–4.0)
MCV: 82.5 fl (ref 78.0–100.0)
Monocytes Absolute: 0.4 10*3/uL (ref 0.1–1.0)
Monocytes Relative: 6.5 % (ref 3.0–12.0)
RBC: 4.18 Mil/uL (ref 3.87–5.11)
WBC: 6 10*3/uL (ref 4.5–10.5)

## 2012-09-18 LAB — BASIC METABOLIC PANEL
BUN: 13 mg/dL (ref 6–23)
Calcium: 9.3 mg/dL (ref 8.4–10.5)
Chloride: 104 mEq/L (ref 96–112)
Creatinine, Ser: 0.7 mg/dL (ref 0.4–1.2)
GFR: 94.55 mL/min (ref >60.00)

## 2012-09-18 LAB — TSH: TSH: 1.23 u[IU]/mL (ref 0.35–5.50)

## 2012-09-18 LAB — HEPATIC FUNCTION PANEL
Bilirubin, Direct: 0 mg/dL (ref 0.0–0.3)
Total Bilirubin: 0.5 mg/dL (ref 0.3–1.2)

## 2012-09-18 LAB — HEMOGLOBIN A1C: Hgb A1c MFr Bld: 5.8 % (ref 4.6–6.5)

## 2012-10-11 ENCOUNTER — Telehealth: Payer: Self-pay | Admitting: Family Medicine

## 2012-10-11 NOTE — Telephone Encounter (Signed)
Pt wants to know if she can also get a rx for the AXERT sent to [facility] at [facility]. Please advise. I am still waiting on prior auth approval for this med - pt aware.

## 2012-10-12 MED ORDER — ALMOTRIPTAN MALATE 12.5 MG PO TABS: ORAL_TABLET | ORAL | Status: DC

## 2012-10-12 NOTE — Telephone Encounter (Signed)
I sent script e-scribe. 

## 2012-10-12 NOTE — Telephone Encounter (Signed)
Call in Axert 12.5 mg to use prn migraine, #10 with 11 rf

## 2012-12-09 ENCOUNTER — Emergency Department (HOSPITAL_COMMUNITY)
Admission: EM | Admit: 2012-12-09 | Discharge: 2012-12-10 | Disposition: A | Discharge status: Home / Self Care | Payer: BC Managed Care – PPO | Attending: Emergency Medicine | Admitting: Emergency Medicine

## 2012-12-09 ENCOUNTER — Encounter (HOSPITAL_COMMUNITY): Payer: Self-pay | Admitting: *Deleted

## 2012-12-09 DIAGNOSIS — R1013 Epigastric pain: Secondary | ICD-10-CM | POA: Insufficient documentation

## 2012-12-09 DIAGNOSIS — R109 Unspecified abdominal pain: Secondary | ICD-10-CM

## 2012-12-09 DIAGNOSIS — F3289 Other specified depressive episodes: Secondary | ICD-10-CM | POA: Insufficient documentation

## 2012-12-09 DIAGNOSIS — F329 Major depressive disorder, single episode, unspecified: Secondary | ICD-10-CM | POA: Insufficient documentation

## 2012-12-09 DIAGNOSIS — Z3202 Encounter for pregnancy test, result negative: Secondary | ICD-10-CM | POA: Insufficient documentation

## 2012-12-09 DIAGNOSIS — Z79899 Other long term (current) drug therapy: Secondary | ICD-10-CM | POA: Insufficient documentation

## 2012-12-09 DIAGNOSIS — R110 Nausea: Secondary | ICD-10-CM

## 2012-12-09 LAB — CBC WITH DIFFERENTIAL/PLATELET
Basophils Absolute: 0 10*3/uL (ref 0.0–0.1)
Eosinophils Relative: 2 % (ref 0–5)
HCT: 36.3 % (ref 36.0–46.0)
Hemoglobin: 11.8 g/dL — ABNORMAL LOW (ref 12.0–15.0)
Lymphocytes Relative: 27 % (ref 12–46)
MCHC: 32.5 g/dL (ref 30.0–36.0)
MCV: 84.8 fL (ref 78.0–100.0)
Monocytes Absolute: 0.4 10*3/uL (ref 0.1–1.0)
Monocytes Relative: 6 % (ref 3–12)
Neutro Abs: 4.3 10*3/uL (ref 1.7–7.7)
RDW: 14.6 % (ref 11.5–15.5)
WBC: 6.6 10*3/uL (ref 4.0–10.5)

## 2012-12-09 LAB — COMPREHENSIVE METABOLIC PANEL
AST: 38 U/L — ABNORMAL HIGH (ref 0–37)
BUN: 13 mg/dL (ref 6–23)
CO2: 24 mEq/L (ref 19–32)
Calcium: 9.4 mg/dL (ref 8.4–10.5)
Chloride: 101 mEq/L (ref 96–112)
Creatinine, Ser: 0.72 mg/dL (ref 0.50–1.10)
GFR calc Af Amer: >90 mL/min (ref >90)
GFR calc non Af Amer: >90 mL/min (ref >90)
Total Bilirubin: 0.3 mg/dL (ref 0.3–1.2)

## 2012-12-09 LAB — LIPASE, BLOOD: Lipase: 56 U/L (ref 11–59)

## 2012-12-09 MED ORDER — MORPHINE SULFATE 2 MG/ML IJ SOLN
2.0000 mg | Freq: Once | INTRAMUSCULAR | Status: DC
  Filled 2012-12-09: qty 1

## 2012-12-09 NOTE — ED Notes (Signed)
Patient states the pain is subsiding and does not want pain medication at this time. Patient rating pain 3/10 at this time.

## 2012-12-09 NOTE — ED Notes (Signed)
Onset of upper abd pain starting around 8 pm, Pt states it lasted for about 45 min, went away and returned after eating.

## 2012-12-09 NOTE — ED Notes (Addendum)
Tatyana, PA at bedside. 

## 2012-12-09 NOTE — ED Notes (Signed)
Patient c/o upper abd pain that started around 2000, lasting about 45 minutes before it resolved. Patient states she ate and the pain returned around 2130. Patient states pain was "so severe it was actually making me sweat". Patient denies nausea at this time. Pain 6-7/10.

## 2012-12-09 NOTE — ED Notes (Signed)
Patient advised we need a urine sample. Patient states she is unable to go at this time.

## 2012-12-10 ENCOUNTER — Emergency Department (HOSPITAL_COMMUNITY): Payer: BC Managed Care – PPO

## 2012-12-10 ENCOUNTER — Telehealth: Payer: Self-pay | Admitting: Family Medicine

## 2012-12-10 LAB — URINALYSIS, ROUTINE W REFLEX MICROSCOPIC
Bilirubin Urine: NEGATIVE
Protein, ur: NEGATIVE mg/dL
Urobilinogen, UA: 0.2 mg/dL (ref 0.0–1.0)

## 2012-12-10 LAB — URINE MICROSCOPIC-ADD ON

## 2012-12-10 LAB — POCT PREGNANCY, URINE: Preg Test, Ur: NEGATIVE

## 2012-12-10 MED ORDER — HYDROCODONE-ACETAMINOPHEN 5-325 MG PO TABS: ORAL_TABLET | ORAL | Status: DC

## 2012-12-10 MED ORDER — PROMETHAZINE HCL 25 MG PO TABS: 25.0000 mg | ORAL_TABLET | Freq: Four times a day (QID) | ORAL | Status: DC | PRN

## 2012-12-10 NOTE — ED Provider Notes (Signed)
The sign out from PA Kirinchenko at shift change:Kathy Lamb is a 47 y.o. female complaining of upper abdominal pain and nausea with no emesis pain is severe, patient however has deferred pain medication. AST is mildly elevated at 38. Patient is pending abdominal ultrasound to evaluate the biliary tree.   Ultrasound shows cholelithiasis without evidence of cholecystitis. Contracted gallbladder.  2:21 AM Pain is resolved. Abdominal exam is benign. Patient is amenable to discharge at this time.  Discussed case with attending who agrees with plan and stability to d/c to home.   Pt is hemodynamically stable, appropriate for, and amenable to discharge at this time. Pt verbalized understanding and agrees with care plan. Outpatient follow-up and specific return precautions discussed.    Discharge Medication List as of 12/10/2012  2:28 AM    START taking these medications   Details  !! HYDROcodone-acetaminophen (NORCO/VICODIN) 5-325 MG per tablet Take 1-2 tablets by mouth every 6 hours as needed for pain., Print    promethazine (PHENERGAN) 25 MG tablet Take 1 tablet (25 mg total) by mouth every 6 (six) hours as needed for nausea., Starting 12/10/2012, Until Discontinued, Print     !! - Potential duplicate medications found. Please discuss with provider.        Wynetta Emery, PA-C 12/10/12 [phone]

## 2012-12-10 NOTE — ED Provider Notes (Signed)
History/physical exam/procedure(s) were performed by non-physician practitioner and as supervising physician I was immediately available for consultation/collaboration. I have reviewed all notes and am in agreement with care and plan.   Hilario Quarry, MD 12/10/12 0700

## 2012-12-10 NOTE — ED Notes (Signed)
US at bedside

## 2012-12-10 NOTE — ED Notes (Signed)
Patient still refusing pain medication.

## 2012-12-10 NOTE — ED Provider Notes (Signed)
History    CSN: [id number] Arrival date & time 12/09/12  2210  First MD Initiated Contact with Patient 12/09/12 2230     Chief Complaint  Patient presents with  . Abdominal Pain   (Consider location/radiation/quality/duration/timing/severity/associated sxs/prior Treatment) HPI Kathy Lamb is a 47 y.o. female who presents to ED with complaint of abdominal pain. States that pain began around 8pm. States pain was sharp, epigastric radiating across upper abdomen, was 9/10. Pain associated with nausea. No vomiting. No diarrhea. States it eased off some. States she ate dinner which included ademame and shrimp. States pain returned. Denies hx of the same. Did not take any medications. No prior abdominal surgeries.   Past Medical History  Diagnosis Date  . Depression   . Headache(784.0)   . Gestational diabetes     resolved   Past Surgical History  Procedure Laterality Date  . No past surgeries     Family History  Problem Relation Age of Onset  . Colon cancer    . Diabetes    . Hypertension    . Lung cancer    . Pancreatic cancer      grandfather  . Arthritis    . Osteoporosis    . Rheum arthritis Mother    History  Substance Use Topics  . Smoking status: Never Smoker   . Smokeless tobacco: Never Used  . Alcohol Use: Yes     Comment: twice a month   OB History   Grav Para Term Preterm Abortions TAB SAB Ect Mult Living                 Review of Systems  Constitutional: Negative for fever and chills.  Respiratory: Negative.   Cardiovascular: Negative.   Gastrointestinal: Positive for nausea and abdominal pain. Negative for vomiting, diarrhea and constipation.  Neurological: Negative for weakness and numbness.    Allergies  Review of patient's allergies indicates no known allergies.  Home Medications   Current Outpatient Rx  Name  Route  Sig  Dispense  Refill  . almotriptan (AXERT) 12.5 MG tablet      may repeat in 2 hours if needed   10 tablet   11    . buPROPion (WELLBUTRIN XL) 300 MG 24 hr tablet      TAKE 1 TABLET EVERY MORNING   90 tablet   0   . HYDROcodone-acetaminophen (NORCO/VICODIN) 5-325 MG per tablet   Oral   Take 1 tablet by mouth every 6 (six) hours as needed for pain.   30 tablet   2   . pseudoephedrine-acetaminophen (TYLENOL SINUS) 30-500 MG TABS   Oral   Take 1 tablet by mouth every 4 (four) hours as needed (headache).         . topiramate (TOPAMAX) 25 MG tablet   Oral   Take 1 tablet (25 mg total) by mouth daily.   30 tablet   2   . venlafaxine XR (EFFEXOR-XR) 75 MG 24 hr capsule   Oral   Take 1 capsule (75 mg total) by mouth daily.   30 capsule   2     Refill Approved    BP 188/110  Pulse 71  Temp(Src) 97.9 F (36.6 C) (Oral)  Resp 16  Ht 5\' 4"  (1.626 m)  Wt 166 lb (75.297 kg)  BMI 28.48 kg/m2  SpO2 99%  LMP 11/26/2012 Physical Exam  Nursing note and vitals reviewed. Constitutional: She is oriented to person, place, and time. She appears well-developed and  well-nourished. No distress.  Eyes: Conjunctivae are normal.  Cardiovascular: Normal rate and regular rhythm.   Pulmonary/Chest: Effort normal and breath sounds normal. No respiratory distress. She has no wheezes.  Abdominal: Soft. Bowel sounds are normal. She exhibits no distension. There is tenderness. There is no rebound and no guarding.  RUQ and epigastric tenderness  Musculoskeletal: She exhibits no edema.  Neurological: She is alert and oriented to person, place, and time.    ED Course  Procedures (including critical care time) Labs Reviewed  CBC WITH DIFFERENTIAL - Abnormal; Notable for the following:    Hemoglobin 11.8 (*)    All other components within normal limits  COMPREHENSIVE METABOLIC PANEL - Abnormal; Notable for the following:    Sodium 134 (*)    Glucose, Bld 143 (*)    AST 38 (*)    All other components within normal limits  URINALYSIS, ROUTINE W REFLEX MICROSCOPIC - Abnormal; Notable for the following:     APPearance TURBID (*)    Hgb urine dipstick MODERATE (*)    All other components within normal limits  LIPASE, BLOOD  URINE MICROSCOPIC-ADD ON  POCT PREGNANCY, URINE    Date: 12/10/2012  Rate: 76  Rhythm: normal sinus rhythm  QRS Axis: normal  Intervals: normal  ST/T Wave abnormalities: normal  Conduction Disutrbances: none  Narrative Interpretation:   Old EKG Reviewed: No significant changes noted    No results found. No diagnosis found.  MDM  Pt with epigastric and RUQ pain, two episodes, associated with nausea. Pt feeling better now. Labs pending. US abdomen ordered, concern for possible biliary colic  Pt signed out at shift change    Lottie Mussel, PA-C 12/10/12 0120

## 2012-12-10 NOTE — ED Provider Notes (Signed)
History/physical exam/procedure(s) were performed by non-physician practitioner and as supervising physician I was immediately available for consultation/collaboration. I have reviewed all notes and am in agreement with care and plan.   Hilario Quarry, MD 12/10/12 [phone]

## 2012-12-10 NOTE — Telephone Encounter (Signed)
Patient Information:  Caller Name: Kasara  Phone: [phone]  Patient: Kathy Lamb, Kathy Lamb  Gender: Female  DOB: Jan 25, 1966  Age: 47 Years  PCP: Gershon Cr[facility])  Pregnant: No  Office Follow Up:  Does the office need to follow up with this patient?: No  Instructions For The Office: N/A  RN Note:  Onset of upper abdominal pain 12/09/12, worsening after eating supper.  Seen in ED/[facility] 2130 for pain severe enough to sweat.  Pain was centered but worse on right.  Told ultrasound showed gallstones, but no blockage of bile duct seen.  Was to follow up with PCP office.  States currently pain is much improved, but still tender to touch and "sore."  Per upper abdominal pain protocol, emergent symptoms denied; advised appt in follow up.  Appt scheduled 12/11/12 1030 with Dr. Clent Ridges.  krs/can  Symptoms  Reason For Call & Symptoms: upper abdominal pain  Reviewed Health History In EMR: Yes  Reviewed Medications In EMR: Yes  Reviewed Allergies In EMR: Yes  Reviewed Surgeries / Procedures: Yes  Date of Onset of Symptoms: 12/09/2012 OB / GYN:  LMP: 11/26/2012  Guideline(s) Used:  Abdominal Pain - Upper  Disposition Per Guideline:   See Today in Office  Reason For Disposition Reached:   Patient wants to be seen  Advice Given:  N/A  Patient Will Follow Care Advice:  YES  Appointment Scheduled:  12/11/2012 10:30:00 Appointment Scheduled Provider:  Gershon Crane [facility])

## 2012-12-10 NOTE — ED Notes (Signed)
US completed

## 2012-12-11 ENCOUNTER — Ambulatory Visit (INDEPENDENT_AMBULATORY_CARE_PROVIDER_SITE_OTHER): Payer: BC Managed Care – PPO | Admitting: Family Medicine

## 2012-12-11 ENCOUNTER — Encounter: Payer: Self-pay | Admitting: Family Medicine

## 2012-12-11 VITALS — BP 140/90 | HR 78 | Temp 98.3°F | Wt 162.0 lb

## 2012-12-11 DIAGNOSIS — K8020 Calculus of gallbladder without cholecystitis without obstruction: Secondary | ICD-10-CM

## 2012-12-11 MED ORDER — TOPIRAMATE 50 MG PO TABS: 50.0000 mg | ORAL_TABLET | Freq: Every day | ORAL | Status: DC

## 2012-12-11 NOTE — Progress Notes (Signed)
  Subjective:    Patient ID: Kathy Lamb, female    DOB: 1966-01-17, 47 y.o.   MRN: [id number]  HPI Here to follow up an ER visit on 12-09-12 for severe epigastric pain. Her workup included an [place] which revealed a gall bladder with multiple stones. She has felt fine since that night.    Review of Systems  Constitutional: Negative.   Respiratory: Negative.   Cardiovascular: Negative.   Gastrointestinal: Positive for nausea and abdominal pain. Negative for vomiting, diarrhea, constipation, blood in stool and abdominal distention.       Objective:   Physical Exam  Constitutional: She appears well-developed and well-nourished.  Cardiovascular: Normal rate, regular rhythm, normal heart sounds and intact distal pulses.   Pulmonary/Chest: Effort normal and breath sounds normal.  Abdominal: Soft. Bowel sounds are normal. She exhibits no distension and no mass. There is no tenderness. There is no rebound and no guarding.          Assessment & Plan:  She probably needs to have her gall bladder removed so we will refer her to Surgery. Advised her to avoid fatty meals.

## 2012-12-24 ENCOUNTER — Ambulatory Visit (INDEPENDENT_AMBULATORY_CARE_PROVIDER_SITE_OTHER): Payer: BC Managed Care – PPO | Admitting: General Surgery

## 2012-12-24 ENCOUNTER — Encounter (INDEPENDENT_AMBULATORY_CARE_PROVIDER_SITE_OTHER): Payer: Self-pay | Admitting: General Surgery

## 2012-12-24 VITALS — BP 146/80 | HR 96 | Resp 14 | Ht 64.0 in | Wt 163.2 lb

## 2012-12-24 DIAGNOSIS — K8020 Calculus of gallbladder without cholecystitis without obstruction: Secondary | ICD-10-CM

## 2012-12-24 NOTE — Patient Instructions (Signed)
Plan for lap chole with ioc 

## 2012-12-24 NOTE — Progress Notes (Signed)
Subjective:     Patient ID: Kathy Lamb, female   DOB: 04/03/1966, 47 y.o.   MRN: [id number]  HPI We're asked to see the patient in consultation by Dr. Clent Ridges to evaluate her for gallstones. The patient is a 47 year old white female who had her first episode of epigastric pain about 2 weeks and is ago. The pain lasted for about 4 hours before subsided. The pain was associated with nausea but no vomiting. The pain was severe enough that she went to the emergency department. An ultrasound showed that she had stones in her gallbladder but no gallbladder wall thickening or ductal dilatation. Her liver functions were essentially normal.  Review of Systems  Constitutional: Negative.   HENT: Negative.   Eyes: Negative.   Respiratory: Negative.   Cardiovascular: Negative.   Gastrointestinal: Positive for nausea and abdominal pain.  Endocrine: Negative.   Genitourinary: Negative.   Musculoskeletal: Negative.   Skin: Negative.   Allergic/Immunologic: Negative.   Neurological: Negative.   Hematological: Negative.   Psychiatric/Behavioral: Negative.        Objective:   Physical Exam  Constitutional: She is oriented to person, place, and time. She appears well-developed and well-nourished.  HENT:  Head: Normocephalic and atraumatic.  Eyes: Conjunctivae and EOM are normal. Pupils are equal, round, and reactive to light.  Neck: Normal range of motion. Neck supple.  Cardiovascular: Normal rate, regular rhythm and normal heart sounds.   Pulmonary/Chest: Effort normal and breath sounds normal.  Abdominal: Soft. Bowel sounds are normal.  Musculoskeletal: Normal range of motion.  Neurological: She is alert and oriented to person, place, and time.  Skin: Skin is warm and dry.  Psychiatric: She has a normal mood and affect. Her behavior is normal.       Assessment:     The patient appears to have symptomatic gallstones. Because the risk of further painful episodes and possible pancreatitis I  think she would benefit from having her gallbladder removed. I've discussed with her in detail the risks and benefits of the operation to remove the gallbladder as well as some of the technical aspects and she understands and wishes to proceed     Plan:     Plan for laparoscopic cholecystectomy with intraoperative cholangiogram

## 2013-01-29 ENCOUNTER — Encounter (HOSPITAL_COMMUNITY): Payer: Self-pay

## 2013-01-30 ENCOUNTER — Encounter (HOSPITAL_COMMUNITY): Payer: Self-pay | Admitting: Pharmacy Technician

## 2013-01-31 ENCOUNTER — Encounter (HOSPITAL_COMMUNITY): Payer: Self-pay

## 2013-01-31 ENCOUNTER — Encounter (HOSPITAL_COMMUNITY)
Admission: RE | Admit: 2013-01-31 | Discharge: 2013-01-31 | Disposition: A | Discharge status: Home / Self Care | Payer: BC Managed Care – PPO | Source: Ambulatory Visit | Attending: General Surgery | Admitting: General Surgery

## 2013-01-31 DIAGNOSIS — Z01818 Encounter for other preprocedural examination: Secondary | ICD-10-CM | POA: Insufficient documentation

## 2013-01-31 HISTORY — DX: Blindness, one eye, unspecified eye: H54.40

## 2013-01-31 HISTORY — DX: Personal history of gestational diabetes: Z86.32

## 2013-01-31 HISTORY — DX: Migraine, unspecified, not intractable, without status migrainosus: G43.909

## 2013-01-31 HISTORY — DX: Calculus of gallbladder without cholecystitis without obstruction: K80.20

## 2013-01-31 HISTORY — DX: Family history of other specified conditions: Z84.89

## 2013-01-31 LAB — CBC
HCT: 35.1 % — ABNORMAL LOW (ref 36.0–46.0)
Hemoglobin: 11.5 g/dL — ABNORMAL LOW (ref 12.0–15.0)
MCH: 28.2 pg (ref 26.0–34.0)
MCHC: 32.8 g/dL (ref 30.0–36.0)
MCV: 86 fL (ref 78.0–100.0)
Platelets: 231 10*3/uL (ref 150–400)
RBC: 4.08 MIL/uL (ref 3.87–5.11)
RDW: 13.8 % (ref 11.5–15.5)
WBC: 6.9 10*3/uL (ref 4.0–10.5)

## 2013-01-31 LAB — HCG, SERUM, QUALITATIVE: Preg, Serum: NEGATIVE

## 2013-01-31 NOTE — Pre-Procedure Instructions (Signed)
IVELISE CASTILLO  01/31/2013   Your procedure is scheduled on:  Thursday, September 18  Report to [facility] at 0530 AM.  Call this number if you have problems the morning of surgery: [phone]   Remember:   Do not eat food or drink liquids after midnight.Wednesday night   Take these medicines the morning of surgery with A SIP OF WATER: bupropion (Wellbutrin), Topamax   Do not wear jewelry, make-up or nail polish.  Do not wear lotions, powders, or perfumes.Do not wear deodorant.  Do not shave 48 hours prior to surgery.    Do not bring valuables to the hospital.  [facility] is not responsible  for any belongings or valuables.  Contacts, dentures or bridgework may not be worn into surgery.   Leave suitcase in the car. After surgery it may be brought to your room.  For patients admitted to the hospital, checkout time is 11:00 AM the day of  discharge.   Patients discharged the day of surgery will not be allowed to drive  home.  Name and phone number of your driver:     Special Instructions: Shower using CHG 2 nights before surgery and the night before surgery.  If you shower the day of surgery use CHG.  Use special wash - you have one bottle of CHG for all showers.  You should use approximately 1/3 of the bottle for each shower.   Please read over the following fact sheets that you were given: Pain Booklet, Coughing and Deep Breathing and Surgical Site Infection Prevention

## 2013-02-06 MED ORDER — CEFAZOLIN SODIUM-DEXTROSE 2-3 GM-% IV SOLR
2.0000 g | INTRAVENOUS | Status: AC
  Administered 2013-02-07: 2 g via INTRAVENOUS
  Filled 2013-02-06: qty 50

## 2013-02-07 ENCOUNTER — Encounter (HOSPITAL_COMMUNITY): Payer: Self-pay | Admitting: Certified Registered"

## 2013-02-07 ENCOUNTER — Encounter (HOSPITAL_COMMUNITY): Payer: Self-pay | Admitting: *Deleted

## 2013-02-07 ENCOUNTER — Encounter (HOSPITAL_COMMUNITY)
Admission: RE | Disposition: A | Discharge status: Home / Self Care | Payer: Self-pay | Source: Ambulatory Visit | Attending: General Surgery

## 2013-02-07 ENCOUNTER — Ambulatory Visit (HOSPITAL_COMMUNITY): Payer: BC Managed Care – PPO | Admitting: Certified Registered"

## 2013-02-07 ENCOUNTER — Ambulatory Visit (HOSPITAL_COMMUNITY)
Admission: RE | Admit: 2013-02-07 | Discharge: 2013-02-07 | Disposition: A | Discharge status: Home / Self Care | Payer: BC Managed Care – PPO | Source: Ambulatory Visit | Attending: General Surgery | Admitting: General Surgery

## 2013-02-07 DIAGNOSIS — K8010 Calculus of gallbladder with chronic cholecystitis without obstruction: Secondary | ICD-10-CM

## 2013-02-07 DIAGNOSIS — K8020 Calculus of gallbladder without cholecystitis without obstruction: Secondary | ICD-10-CM

## 2013-02-07 HISTORY — PX: CHOLECYSTECTOMY: SHX55

## 2013-02-07 SURGERY — LAPAROSCOPIC CHOLECYSTECTOMY WITH INTRAOPERATIVE CHOLANGIOGRAM
Anesthesia: General | Site: Abdomen | Wound class: Clean Contaminated

## 2013-02-07 MED ORDER — BUPIVACAINE-EPINEPHRINE PF 0.25-1:200000 % IJ SOLN
INTRAMUSCULAR | Status: AC
  Filled 2013-02-07: qty 30

## 2013-02-07 MED ORDER — CHLORHEXIDINE GLUCONATE 4 % EX LIQD: 1.0000 "application " | Freq: Once | CUTANEOUS | Status: DC

## 2013-02-07 MED ORDER — HYDROMORPHONE HCL PF 1 MG/ML IJ SOLN
0.2500 mg | INTRAMUSCULAR | Status: DC | PRN
  Administered 2013-02-07 (×2): 0.5 mg via INTRAVENOUS

## 2013-02-07 MED ORDER — OXYCODONE HCL 5 MG/5ML PO SOLN: 5.0000 mg | Freq: Once | ORAL | Status: DC | PRN

## 2013-02-07 MED ORDER — PROPOFOL 10 MG/ML IV BOLUS
INTRAVENOUS | Status: DC | PRN
  Administered 2013-02-07: 160 mg via INTRAVENOUS
  Administered 2013-02-07: 40 mg via INTRAVENOUS

## 2013-02-07 MED ORDER — NEOSTIGMINE METHYLSULFATE 1 MG/ML IJ SOLN
INTRAMUSCULAR | Status: DC | PRN
  Administered 2013-02-07: 5 mg via INTRAVENOUS

## 2013-02-07 MED ORDER — HYDROCODONE-ACETAMINOPHEN 5-325 MG PO TABS: 1.0000 | ORAL_TABLET | ORAL | Status: DC | PRN

## 2013-02-07 MED ORDER — LIDOCAINE HCL (CARDIAC) 20 MG/ML IV SOLN
INTRAVENOUS | Status: DC | PRN
  Administered 2013-02-07: 80 mg via INTRAVENOUS

## 2013-02-07 MED ORDER — LABETALOL HCL 5 MG/ML IV SOLN
INTRAVENOUS | Status: DC | PRN
  Administered 2013-02-07 (×2): 5 mg via INTRAVENOUS

## 2013-02-07 MED ORDER — GLYCOPYRROLATE 0.2 MG/ML IJ SOLN
INTRAMUSCULAR | Status: DC | PRN
  Administered 2013-02-07: 0.6 mg via INTRAVENOUS

## 2013-02-07 MED ORDER — ONDANSETRON HCL 4 MG/2ML IJ SOLN
INTRAMUSCULAR | Status: DC | PRN
  Administered 2013-02-07 (×2): 4 mg via INTRAVENOUS

## 2013-02-07 MED ORDER — ROCURONIUM BROMIDE 100 MG/10ML IV SOLN
INTRAVENOUS | Status: DC | PRN
  Administered 2013-02-07: 50 mg via INTRAVENOUS

## 2013-02-07 MED ORDER — BUPIVACAINE-EPINEPHRINE 0.25% -1:200000 IJ SOLN
INTRAMUSCULAR | Status: DC | PRN
  Administered 2013-02-07: 30 mL

## 2013-02-07 MED ORDER — SODIUM CHLORIDE 0.9 % IV SOLN
INTRAVENOUS | Status: DC | PRN
  Administered 2013-02-07: 11:00:00

## 2013-02-07 MED ORDER — 0.9 % SODIUM CHLORIDE (POUR BTL) OPTIME
TOPICAL | Status: DC | PRN
  Administered 2013-02-07: 1000 mL

## 2013-02-07 MED ORDER — LACTATED RINGERS IV SOLN
INTRAVENOUS | Status: DC | PRN
  Administered 2013-02-07 (×2): via INTRAVENOUS
  Administered 2013-02-07: 1000 mL via INTRAVENOUS

## 2013-02-07 MED ORDER — DEXAMETHASONE SODIUM PHOSPHATE 4 MG/ML IJ SOLN
INTRAMUSCULAR | Status: DC | PRN
  Administered 2013-02-07: 8 mg via INTRAVENOUS

## 2013-02-07 MED ORDER — ONDANSETRON HCL 4 MG/2ML IJ SOLN: 4.0000 mg | Freq: Once | INTRAMUSCULAR | Status: DC | PRN

## 2013-02-07 MED ORDER — ARTIFICIAL TEARS OP OINT
TOPICAL_OINTMENT | OPHTHALMIC | Status: DC | PRN
  Administered 2013-02-07: 1 via OPHTHALMIC

## 2013-02-07 MED ORDER — HYDROMORPHONE HCL PF 1 MG/ML IJ SOLN
INTRAMUSCULAR | Status: AC
  Filled 2013-02-07: qty 1

## 2013-02-07 MED ORDER — FENTANYL CITRATE 0.05 MG/ML IJ SOLN
INTRAMUSCULAR | Status: DC | PRN
  Administered 2013-02-07 (×5): 50 ug via INTRAVENOUS
  Administered 2013-02-07: 100 ug via INTRAVENOUS
  Administered 2013-02-07: 50 ug via INTRAVENOUS
  Administered 2013-02-07: 100 ug via INTRAVENOUS

## 2013-02-07 MED ORDER — MEPERIDINE HCL 25 MG/ML IJ SOLN: 6.2500 mg | INTRAMUSCULAR | Status: DC | PRN

## 2013-02-07 MED ORDER — OXYCODONE HCL 5 MG PO TABS: 5.0000 mg | ORAL_TABLET | Freq: Once | ORAL | Status: DC | PRN

## 2013-02-07 MED ORDER — MIDAZOLAM HCL 5 MG/5ML IJ SOLN
INTRAMUSCULAR | Status: DC | PRN
  Administered 2013-02-07: 2 mg via INTRAVENOUS

## 2013-02-07 SURGICAL SUPPLY — 48 items
ADH SKN CLS APL DERMABOND .7 (GAUZE/BANDAGES/DRESSINGS) ×1
APPLIER CLIP ROT 10 11.4 M/L (STAPLE) ×2
APR CLP MED LRG 11.4X10 (STAPLE) ×1
BAG SPEC RTRVL LRG 6X4 10 (ENDOMECHANICALS) ×1
BLADE SURG ROTATE 9660 (MISCELLANEOUS) IMPLANT
CANISTER SUCTION 2500CC (MISCELLANEOUS) ×2 IMPLANT
CATH REDDICK CHOLANGI 4FR 50CM (CATHETERS) ×2 IMPLANT
CHLORAPREP W/TINT 26ML (MISCELLANEOUS) ×2 IMPLANT
CLIP APPLIE ROT 10 11.4 M/L (STAPLE) ×1 IMPLANT
CLOTH BEACON ORANGE TIMEOUT ST (SAFETY) ×2 IMPLANT
COVER MAYO STAND STRL (DRAPES) ×2 IMPLANT
COVER SURGICAL LIGHT HANDLE (MISCELLANEOUS) ×2 IMPLANT
DECANTER SPIKE VIAL GLASS SM (MISCELLANEOUS) ×2 IMPLANT
DERMABOND ADVANCED (GAUZE/BANDAGES/DRESSINGS) ×1
DERMABOND ADVANCED .7 DNX12 (GAUZE/BANDAGES/DRESSINGS) ×1 IMPLANT
DRAPE C-ARM 42X72 X-RAY (DRAPES) IMPLANT
DRAPE UTILITY 15X26 W/TAPE STR (DRAPE) ×4 IMPLANT
ELECT REM PT RETURN 9FT ADLT (ELECTROSURGICAL) ×2
ELECTRODE REM PT RTRN 9FT ADLT (ELECTROSURGICAL) ×1 IMPLANT
GLOVE BIO SURGEON STRL SZ7 (GLOVE) ×2 IMPLANT
GLOVE BIO SURGEON STRL SZ7.5 (GLOVE) ×2 IMPLANT
GLOVE BIOGEL PI IND STRL 6.5 (GLOVE) ×1 IMPLANT
GLOVE BIOGEL PI IND STRL 7.0 (GLOVE) IMPLANT
GLOVE BIOGEL PI INDICATOR 6.5 (GLOVE) ×1
GLOVE BIOGEL PI INDICATOR 7.0 (GLOVE) ×2
GLOVE ECLIPSE 6.5 STRL STRAW (GLOVE) IMPLANT
GLOVE SS BIOGEL STRL SZ 7 (GLOVE) IMPLANT
GLOVE SUPERSENSE BIOGEL SZ 7 (GLOVE) ×1
GLOVE SURG SS PI 6.0 STRL IVOR (GLOVE) ×1 IMPLANT
GOWN STRL NON-REIN LRG LVL3 (GOWN DISPOSABLE) ×8 IMPLANT
IV CATH 14GX2 1/4 (CATHETERS) ×2 IMPLANT
KIT BASIN OR (CUSTOM PROCEDURE TRAY) ×2 IMPLANT
KIT ROOM TURNOVER OR (KITS) ×2 IMPLANT
NS IRRIG 1000ML POUR BTL (IV SOLUTION) ×2 IMPLANT
PAD ARMBOARD 7.5X6 YLW CONV (MISCELLANEOUS) ×2 IMPLANT
POUCH SPECIMEN RETRIEVAL 10MM (ENDOMECHANICALS) ×2 IMPLANT
SCISSORS LAP 5X35 DISP (ENDOMECHANICALS) IMPLANT
SET IRRIG TUBING LAPAROSCOPIC (IRRIGATION / IRRIGATOR) ×2 IMPLANT
SLEEVE ENDOPATH XCEL 5M (ENDOMECHANICALS) ×2 IMPLANT
SPECIMEN JAR SMALL (MISCELLANEOUS) ×2 IMPLANT
SUT MNCRL AB 4-0 PS2 18 (SUTURE) ×2 IMPLANT
TOWEL OR 17X24 6PK STRL BLUE (TOWEL DISPOSABLE) ×2 IMPLANT
TOWEL OR 17X26 10 PK STRL BLUE (TOWEL DISPOSABLE) ×2 IMPLANT
TRAY LAPAROSCOPIC (CUSTOM PROCEDURE TRAY) ×2 IMPLANT
TROCAR XCEL BLUNT TIP 100MML (ENDOMECHANICALS) ×2 IMPLANT
TROCAR XCEL NON-BLD 11X100MML (ENDOMECHANICALS) ×2 IMPLANT
TROCAR XCEL NON-BLD 5MMX100MML (ENDOMECHANICALS) ×2 IMPLANT
TUBING INSUF HEATED (TUBING) ×2 IMPLANT

## 2013-02-07 NOTE — Anesthesia Postprocedure Evaluation (Signed)
  Anesthesia Post-op Note  Patient: Kathy Lamb  Procedure(s) Performed: Procedure(s): LAPAROSCOPIC CHOLECYSTECTOMY WITH INTRAOPERATIVE CHOLANGIOGRAM (N/A)  Patient Location: PACU  Anesthesia Type:General  Level of Consciousness: awake, oriented, sedated and patient cooperative  Airway and Oxygen Therapy: Patient Spontanous Breathing  Post-op Pain: mild  Post-op Assessment: Post-op Vital signs reviewed, Patient's Cardiovascular Status Stable, Respiratory Function Stable, Patent Airway, No signs of Nausea or vomiting and Pain level controlled  Post-op Vital Signs: stable  Complications: No apparent anesthesia complications

## 2013-02-07 NOTE — Preoperative (Signed)
Beta Blockers   Reason not to administer Beta Blockers:Not Applicable 

## 2013-02-07 NOTE — H&P (Signed)
Kathy Lamb  12/24/2012 11:30 AM   Office Visit  MRN:  [id number]   Description: 47 year old female  Provider: Caleen Essex III, MD  Department: [facility]        Diagnoses    Gallstones    -  Primary    [phone]      Reason for Visit    New Evaluation    eval GB        Current Vitals - Last Recorded    BP Pulse Resp Ht Wt BMI    146/80 96 14 5\' 4"  (1.626 m) 163 lb 3.2 oz (74.027 kg) 28 kg/m2       LMP              11/26/2012                 Progress Notes    Kathy Askew, MD at 12/24/2012 11:45 AM    Status: Signed                   Subjective:       Patient ID: Kathy Lamb, female   DOB: [birth year], 47 y.o.   MRN: [id number]   HPI We're asked to see the patient in consultation by Dr. Clent Ridges to evaluate her for gallstones. The patient is a 47 year old white female who had her first episode of epigastric pain about 2 weeks and is ago. The pain lasted for about 4 hours before subsided. The pain was associated with nausea but no vomiting. The pain was severe enough that she went to the emergency department. An ultrasound showed that she had stones in her gallbladder but no gallbladder wall thickening or ductal dilatation. Her liver functions were essentially normal.   Review of Systems  Constitutional: Negative.   HENT: Negative.   Eyes: Negative.   Respiratory: Negative.   Cardiovascular: Negative.   Gastrointestinal: Positive for nausea and abdominal pain.  Endocrine: Negative.   Genitourinary: Negative.   Musculoskeletal: Negative.   Skin: Negative.   Allergic/Immunologic: Negative.   Neurological: Negative.   Hematological: Negative.   Psychiatric/Behavioral: Negative.           Objective:     Physical Exam  Constitutional: She is oriented to person, place, and time. She appears well-developed and well-nourished.  HENT:   Head: Normocephalic and atraumatic.  Eyes: Conjunctivae and EOM are normal. Pupils are equal, round, and reactive  to light.  Neck: Normal range of motion. Neck supple.  Cardiovascular: Normal rate, regular rhythm and normal heart sounds.   Pulmonary/Chest: Effort normal and breath sounds normal.  Abdominal: Soft. Bowel sounds are normal.  Musculoskeletal: Normal range of motion.  Neurological: She is alert and oriented to person, place, and time.  Skin: Skin is warm and dry.  Psychiatric: She has a normal mood and affect. Her behavior is normal.          Assessment:       The patient appears to have symptomatic gallstones. Because the risk of further painful episodes and possible pancreatitis I think she would benefit from having her gallbladder removed. I've discussed with her in detail the risks and benefits of the operation to remove the gallbladder as well as some of the technical aspects and she understands and wishes to proceed      Plan:       Plan for laparoscopic cholecystectomy with intraoperative cholangiogram

## 2013-02-07 NOTE — Anesthesia Preprocedure Evaluation (Addendum)
Anesthesia Evaluation  Patient identified by MRN, date of birth, ID band Patient awake    Reviewed: Allergy & Precautions, H&P , NPO status , Patient's Chart, lab work & pertinent test results, reviewed documented beta blocker date and time   Airway Mallampati: I TM Distance: >3 FB Neck ROM: Full    Dental  (+) Teeth Intact and Dental Advisory Given   Pulmonary          Cardiovascular     Neuro/Psych  Headaches, Depression    GI/Hepatic   Endo/Other    Renal/GU      Musculoskeletal   Abdominal   Peds  Hematology   Anesthesia Other Findings   Reproductive/Obstetrics                         Anesthesia Physical Anesthesia Plan  ASA: II  Anesthesia Plan: General   Post-op Pain Management:    Induction: Intravenous  Airway Management Planned: Oral ETT  Additional Equipment:   Intra-op Plan:   Post-operative Plan: Extubation in OR  Informed Consent: I have reviewed the patients History and Physical, chart, labs and discussed the procedure including the risks, benefits and alternatives for the proposed anesthesia with the patient or authorized representative who has indicated his/her understanding and acceptance.   Dental advisory given  Plan Discussed with: CRNA, Surgeon and Anesthesiologist  Anesthesia Plan Comments:        Anesthesia Quick Evaluation

## 2013-02-07 NOTE — Anesthesia Procedure Notes (Signed)
Procedure Name: Intubation Date/Time: 02/07/2013 9:51 AM Performed by: Brien Mates DOBSON Pre-anesthesia Checklist: Patient identified, Emergency Drugs available, Suction available and Patient being monitored Patient Re-evaluated:Patient Re-evaluated prior to inductionOxygen Delivery Method: Circle system utilized Preoxygenation: Pre-oxygenation with 100% oxygen Intubation Type: IV induction Ventilation: Mask ventilation without difficulty Laryngoscope Size: Miller and 2 Grade View: Grade I Tube type: Oral Tube size: 7.5 mm Number of attempts: 1 Airway Equipment and Method: Stylet Placement Confirmation: ETT inserted through vocal cords under direct vision,  positive ETCO2 and breath sounds checked- equal and bilateral Secured at: 20 cm Tube secured with: Tape Dental Injury: Teeth and Oropharynx as per pre-operative assessment

## 2013-02-07 NOTE — Op Note (Signed)
02/07/2013  11:26 AM  PATIENT:  Kathy Lamb  47 y.o. female  PRE-OPERATIVE DIAGNOSIS:  Gallstones  POST-OPERATIVE DIAGNOSIS:  Gallstones with cholecystitis  PROCEDURE:  Procedure(s): LAPAROSCOPIC CHOLECYSTECTOMY WITH INTRAOPERATIVE CHOLANGIOGRAM (N/A)  SURGEON:  Surgeon(s) and Role:    * Robyne Askew, MD - Primary  PHYSICIAN ASSISTANT:   ASSISTANTS: none   ANESTHESIA:   general  EBL:  Total I/O In: 1700 [I.V.:1700] Out: 25 [Blood:25]  BLOOD ADMINISTERED:none  DRAINS: none   LOCAL MEDICATIONS USED:  MARCAINE     SPECIMEN:  Source of Specimen:  gallbladder  DISPOSITION OF SPECIMEN:  PATHOLOGY  COUNTS:  YES  TOURNIQUET:  * No tourniquets in log *  DICTATION: .Dragon Dictation  Procedure: After informed consent was obtained the patient was brought to the operating room and placed in the supine position on the operating room table. After adequate induction of general anesthesia the patient's abdomen was prepped with ChloraPrep allowed to dry and draped in usual sterile manner. The area below the umbilicus was infiltrated with quarter percent  Marcaine. A small incision was made with a 15 blade knife. The incision was carried down through the subcutaneous tissue bluntly with a hemostat and [employer] retractors. The linea alba was identified. The linea alba was incised with a 15 blade knife and each side was grasped with Coker clamps. The preperitoneal space was then probed with a hemostat until the peritoneum was opened and access was gained to the abdominal cavity. A 0 Vicryl pursestring stitch was placed in the fascia surrounding the opening. A Hassan cannula was then placed through the opening and anchored in place with the previously placed Vicryl purse string stitch. The abdomen was insufflated with carbon dioxide without difficulty. A laparoscope was inserted through the [facility] cannula in the right upper quadrant was inspected. Next the epigastric region was infiltrated  with % Marcaine. A small incision was made with a 15 blade knife. A 10 mm port was placed bluntly through this incision into the abdominal cavity under direct vision. Next 2 sites were chosen laterally on the right side of the abdomen for placement of 5 mm ports. Each of these areas was infiltrated with quarter percent Marcaine. Small stab incisions were made with a 15 blade knife. 5 mm ports were then placed bluntly through these incisions into the abdominal cavity under direct vision without difficulty. A blunt grasper was placed through the lateralmost 5 mm port and used to grasp the dome of the gallbladder and elevated anteriorly and superiorly. Another blunt grasper was placed through the other 5 mm port and used to retract the body and neck of the gallbladder. A dissector was placed through the epigastric port and using the electrocautery the peritoneal reflection at the gallbladder neck was opened. Blunt dissection was then carried out in this area until the gallbladder neck-cystic duct junction was readily identified and a good window was created. A single clip was placed on the gallbladder neck. A small  ductotomy was made just below the clip with laparoscopic scissors. A 14-gauge Angiocath was then placed through the anterior abdominal wall under direct vision. A Reddick cholangiogram catheter was then placed through the Angiocath and flushed. The catheter was then placed in the cystic duct and anchored in place with a clip. A cholangiogram was obtained that showed no filling defects good emptying into the duodenum an adequate length on the cystic duct. The anchoring clip and catheters were then removed from the patient. 3 clips were placed  proximally on the cystic duct and the duct was divided between the 2 sets of clips. Posterior to this the cystic artery was identified and again dissected bluntly in a circumferential manner until a good window  was created. 2 clips were placed proximally and one  distally on the artery and the artery was divided between the 2 sets of clips. Next a laparoscopic hook cautery device was used to separate the gallbladder from the liver bed. Prior to completely detaching the gallbladder from the liver bed the liver bed was inspected and several small bleeding points were coagulated with the electrocautery until the area was completely hemostatic. The gallbladder was then detached the rest of it from the liver bed without difficulty. A laparoscopic bag was inserted through the epigastric port. The gallbladder was placed within the bag and the bag was sealed. A laparoscope was then moved to the epigastric port. The gallbladder grasper was placed through the [facility] cannula and used to grasp the opening of the bag. The bag with the gallbladder was then removed with the [facility] cannula through the infraumbilical port without difficulty. The fascial defect was then closed with the previously placed Vicryl pursestring stitch as well as with another figure-of-eight 0 Vicryl stitch. The liver bed was inspected again and found to be hemostatic. The abdomen was irrigated with copious amounts of saline until the effluent was clear. The ports were then removed under direct vision without difficulty and were found to be hemostatic. The gas was allowed to escape. The skin incisions were all closed with interrupted 4-0 Monocryl subcuticular stitches. Dermabond dressings were applied. The patient tolerated the procedure well. At the end of the case all needle sponge and instrument counts were correct. The patient was then awakened and taken to recovery in stable condition  PLAN OF CARE: Discharge to home after PACU  PATIENT DISPOSITION:  PACU - hemodynamically stable.   Delay start of Pharmacological VTE agent (>24hrs) due to surgical blood loss or risk of bleeding: not applicable

## 2013-02-07 NOTE — Transfer of Care (Signed)
Immediate Anesthesia Transfer of Care Note  Patient: Kathy Lamb  Procedure(s) Performed: Procedure(s): LAPAROSCOPIC CHOLECYSTECTOMY WITH INTRAOPERATIVE CHOLANGIOGRAM (N/A)  Patient Location: PACU  Anesthesia Type:General  Level of Consciousness: awake  Airway & Oxygen Therapy: Patient Spontanous Breathing and Patient connected to nasal cannula oxygen  Post-op Assessment: Report given to PACU RN and Post -op Vital signs reviewed and stable  Post vital signs: Reviewed and stable  Complications: No apparent anesthesia complications

## 2013-02-07 NOTE — Interval H&P Note (Signed)
History and Physical Interval Note:  02/07/2013 8:05 AM  Kathy Lamb  has presented today for surgery, with the diagnosis of gallstones  The various methods of treatment have been discussed with the patient and family. After consideration of risks, benefits and other options for treatment, the patient has consented to  Procedure(s): LAPAROSCOPIC CHOLECYSTECTOMY WITH INTRAOPERATIVE CHOLANGIOGRAM (N/A) as a surgical intervention .  The patient's history has been reviewed, patient examined, no change in status, stable for surgery.  I have reviewed the patient's chart and labs.  Questions were answered to the patient's satisfaction.     TOTH III,Elfriede Bonini S

## 2013-02-10 ENCOUNTER — Encounter (HOSPITAL_COMMUNITY): Payer: Self-pay | Admitting: General Surgery

## 2013-03-01 ENCOUNTER — Encounter (INDEPENDENT_AMBULATORY_CARE_PROVIDER_SITE_OTHER): Payer: BC Managed Care – PPO | Admitting: General Surgery

## 2013-03-04 ENCOUNTER — Ambulatory Visit (INDEPENDENT_AMBULATORY_CARE_PROVIDER_SITE_OTHER): Payer: BC Managed Care – PPO | Admitting: General Surgery

## 2013-03-04 ENCOUNTER — Encounter (INDEPENDENT_AMBULATORY_CARE_PROVIDER_SITE_OTHER): Payer: Self-pay | Admitting: General Surgery

## 2013-03-04 VITALS — BP 136/94 | HR 90 | Temp 97.4°F | Ht 64.0 in | Wt 162.0 lb

## 2013-03-04 DIAGNOSIS — K8020 Calculus of gallbladder without cholecystitis without obstruction: Secondary | ICD-10-CM

## 2013-03-04 NOTE — Patient Instructions (Signed)
May return to normal activities 

## 2013-03-04 NOTE — Progress Notes (Signed)
Subjective:     Patient ID: Kathy Lamb, female   DOB: 11/28/1965, 47 y.o.   MRN: [id number]  HPI The patient is a 47 year old white female who is one month status post laparoscopic cholecystectomy for cholelithiasis and cholecystitis. She is doing well now and has no complaints. Her appetite is good and her bowels are working normally.  Review of Systems     Objective:   Physical Exam  On exam her abdomen is soft and nontender. Her incisions are all healing nicely with no sign of infection.    Assessment:     The patient is one month status post laparoscopic cholecystectomy     Plan:     At this point she may return to her normal activities. We will plan to see her back on a when necessary basis

## 2013-03-28 ENCOUNTER — Other Ambulatory Visit: Payer: Self-pay

## 2013-07-02 ENCOUNTER — Telehealth: Payer: Self-pay | Admitting: Family Medicine

## 2013-07-02 NOTE — Telephone Encounter (Signed)
Call in both please

## 2013-07-02 NOTE — Telephone Encounter (Signed)
Pt needs new rxs generic wellbutrin xl 300 mg #30 and venlafaxine 75 mg #30 [facility]. Pt is waiting on mailorder

## 2013-07-04 MED ORDER — BUPROPION HCL ER (XL) 300 MG PO TB24: ORAL_TABLET | ORAL | Status: DC

## 2013-07-04 MED ORDER — VENLAFAXINE HCL ER 75 MG PO CP24: 75.0000 mg | ORAL_CAPSULE | Freq: Every day | ORAL | Status: DC

## 2013-07-04 NOTE — Telephone Encounter (Signed)
I sent both scripts e-scribe and left a voice message for pt. 

## 2013-07-23 ENCOUNTER — Other Ambulatory Visit: Payer: Self-pay | Admitting: Family Medicine

## 2013-07-30 ENCOUNTER — Telehealth: Payer: Self-pay | Admitting: Family Medicine

## 2013-07-30 NOTE — Telephone Encounter (Signed)
[facility] requesting refill of almotriptan (AXERT) 12.5 MG tablet

## 2013-07-31 MED ORDER — ALMOTRIPTAN MALATE 12.5 MG PO TABS: ORAL_TABLET | ORAL | Status: DC

## 2013-07-31 NOTE — Telephone Encounter (Signed)
I sent script e-scribe. 

## 2013-10-28 ENCOUNTER — Other Ambulatory Visit: Payer: Self-pay | Admitting: Family Medicine

## 2013-11-21 ENCOUNTER — Telehealth: Payer: Self-pay | Admitting: Family Medicine

## 2013-11-21 DIAGNOSIS — Z0000 Encounter for general adult medical examination without abnormal findings: Secondary | ICD-10-CM

## 2013-11-21 NOTE — Telephone Encounter (Signed)
Patient is requesting a referral for a colonoscopy.

## 2013-11-25 NOTE — Telephone Encounter (Signed)
I left a voice message for pt with below information.  

## 2013-11-25 NOTE — Telephone Encounter (Signed)
Referral was done  

## 2013-11-28 ENCOUNTER — Encounter: Payer: Self-pay | Admitting: Gastroenterology

## 2013-12-18 ENCOUNTER — Ambulatory Visit (INDEPENDENT_AMBULATORY_CARE_PROVIDER_SITE_OTHER): Payer: BC Managed Care – PPO | Admitting: Family Medicine

## 2013-12-18 ENCOUNTER — Telehealth: Payer: Self-pay | Admitting: Family Medicine

## 2013-12-18 ENCOUNTER — Encounter: Payer: Self-pay | Admitting: Family Medicine

## 2013-12-18 VITALS — BP 156/90 | Temp 98.5°F | Ht 64.0 in | Wt 169.0 lb

## 2013-12-18 DIAGNOSIS — R51 Headache: Secondary | ICD-10-CM

## 2013-12-18 MED ORDER — ALMOTRIPTAN MALATE 12.5 MG PO TABS: ORAL_TABLET | ORAL | Status: DC

## 2013-12-18 MED ORDER — TOPIRAMATE 50 MG PO TABS: 50.0000 mg | ORAL_TABLET | Freq: Every day | ORAL | Status: DC

## 2013-12-18 MED ORDER — METAXALONE 800 MG PO TABS: 800.0000 mg | ORAL_TABLET | Freq: Four times a day (QID) | ORAL | Status: DC | PRN

## 2013-12-18 MED ORDER — KETOROLAC TROMETHAMINE 60 MG/2ML IM SOLN
60.0000 mg | Freq: Once | INTRAMUSCULAR | Status: AC
  Administered 2013-12-18: 60 mg via INTRAMUSCULAR

## 2013-12-18 MED ORDER — TOPIRAMATE 50 MG PO TABS
50.0000 mg | ORAL_TABLET | Freq: Every day | ORAL | Status: DC
Start: 2013-12-18 — End: 2013-12-18

## 2013-12-18 NOTE — Progress Notes (Signed)
Pre visit review using our clinic review tool, if applicable. No additional management support is needed unless otherwise documented below in the visit note. 

## 2013-12-18 NOTE — Telephone Encounter (Signed)
Pt saw dr fry this am and was supposed to get 3 scripts. Only received 2. The metaxalone (SKELAXIN) 800 MG tablet 60   was not at pharm. Can you re-send to [facility]/ [address] ([facility]

## 2013-12-18 NOTE — Progress Notes (Signed)
   Subjective:    Patient ID: Kathy Lamb, female    DOB: April 20, 1966, 48 y.o.   MRN: [id number]  HPI Here for 5 days of a typical migraine for her, which is across the forehead. Some nausea but no vomiting. Excedrin Migraine has not helped.    Review of Systems  Constitutional: Negative.   Neurological: Positive for headaches. Negative for dizziness, tremors, seizures, syncope, facial asymmetry, speech difficulty, weakness and numbness.       Objective:   Physical Exam  Constitutional: She is oriented to person, place, and time. She appears well-developed and well-nourished.  HENT:  Head: Normocephalic and atraumatic.  Eyes: Conjunctivae and EOM are normal. Pupils are equal, round, and reactive to light.  Cardiovascular: Normal rate, regular rhythm, normal heart sounds and intact distal pulses.   Pulmonary/Chest: Effort normal and breath sounds normal.  Neurological: She is alert and oriented to person, place, and time. No cranial nerve deficit.          Assessment & Plan:  Given a shot of Toradol. She can use Skelaxin and heat at home. Get back on Topamax.

## 2013-12-18 NOTE — Addendum Note (Signed)
Addended by: Aggie Hacker A on: 12/18/2013 12:18 PM   Modules accepted: Orders

## 2013-12-18 NOTE — Telephone Encounter (Signed)
I resent script to [facility] and spoke with pt.

## 2014-01-21 ENCOUNTER — Ambulatory Visit (AMBULATORY_SURGERY_CENTER): Payer: Self-pay | Admitting: *Deleted

## 2014-01-21 VITALS — Ht 64.0 in | Wt 168.8 lb

## 2014-01-21 DIAGNOSIS — Z1211 Encounter for screening for malignant neoplasm of colon: Secondary | ICD-10-CM

## 2014-01-21 MED ORDER — MOVIPREP 100 G PO SOLR: 1.0000 | Freq: Once | ORAL | Status: DC

## 2014-01-21 NOTE — Progress Notes (Signed)
Pt states no problems with past sedation. ewm No home 02 use. emw No cpap use. ewm emmi video to pt's e mail. ewm

## 2014-01-24 ENCOUNTER — Encounter: Payer: Self-pay | Admitting: Gastroenterology

## 2014-02-04 ENCOUNTER — Encounter: Payer: Self-pay | Admitting: Gastroenterology

## 2014-02-04 ENCOUNTER — Ambulatory Visit (AMBULATORY_SURGERY_CENTER): Payer: BC Managed Care – PPO | Admitting: Gastroenterology

## 2014-02-04 VITALS — BP 162/94 | HR 66 | Temp 97.7°F | Resp 14 | Ht 64.0 in | Wt 168.0 lb

## 2014-02-04 DIAGNOSIS — Z1211 Encounter for screening for malignant neoplasm of colon: Secondary | ICD-10-CM

## 2014-02-04 DIAGNOSIS — D128 Benign neoplasm of rectum: Secondary | ICD-10-CM

## 2014-02-04 DIAGNOSIS — D126 Benign neoplasm of colon, unspecified: Secondary | ICD-10-CM

## 2014-02-04 DIAGNOSIS — D129 Benign neoplasm of anus and anal canal: Secondary | ICD-10-CM

## 2014-02-04 HISTORY — PX: COLONOSCOPY: SHX174

## 2014-02-04 MED ORDER — SODIUM CHLORIDE 0.9 % IV SOLN: 500.0000 mL | INTRAVENOUS | Status: DC

## 2014-02-04 NOTE — Progress Notes (Signed)
Called to room to assist during endoscopic procedure.  Patient ID and intended procedure confirmed with present staff. Received instructions for my participation in the procedure from the performing physician.  

## 2014-02-04 NOTE — Patient Instructions (Signed)
YOU HAD AN ENDOSCOPIC PROCEDURE TODAY AT THE [facility]: Refer to the procedure report that was given to you for any specific questions about what was found during the examination.  If the procedure report does not answer your questions, please call your gastroenterologist to clarify.  If you requested that your care partner not be given the details of your procedure findings, then the procedure report has been included in a sealed envelope for you to review at your convenience later.  YOU SHOULD EXPECT: Some feelings of bloating in the abdomen. Passage of more gas than usual.  Walking can help get rid of the air that was put into your GI tract during the procedure and reduce the bloating. If you had a lower endoscopy (such as a colonoscopy or flexible sigmoidoscopy) you may notice spotting of blood in your stool or on the toilet paper. If you underwent a bowel prep for your procedure, then you may not have a normal bowel movement for a few days.  DIET: Your first meal following the procedure should be a light meal and then it is ok to progress to your normal diet.  A half-sandwich or bowl of soup is an example of a good first meal.  Heavy or fried foods are harder to digest and may make you feel nauseous or bloated.  Likewise meals heavy in dairy and vegetables can cause extra gas to form and this can also increase the bloating.  Drink plenty of fluids but you should avoid alcoholic beverages for 24 hours.  ACTIVITY: Your care partner should take you home directly after the procedure.  You should plan to take it easy, moving slowly for the rest of the day.  You can resume normal activity the day after the procedure however you should NOT DRIVE or use heavy machinery for 24 hours (because of the sedation medicines used during the test).    SYMPTOMS TO REPORT IMMEDIATELY: A gastroenterologist can be reached at any hour.  During normal business hours, 8:30 AM to 5:00 PM Monday through Friday,  call [phone].  After hours and on weekends, please call the GI answering service at [phone] who will take a message and have the physician on call contact you.   Following lower endoscopy (colonoscopy or flexible sigmoidoscopy):  Excessive amounts of blood in the stool  Significant tenderness or worsening of abdominal pains  Swelling of the abdomen that is new, acute  Fever of 100F or higher   FOLLOW UP: If any biopsies were taken you will be contacted by phone or by letter within the next 1-3 weeks.  Call your gastroenterologist if you have not heard about the biopsies in 3 weeks.  Our staff will call the home number listed on your records the next business day following your procedure to check on you and address any questions or concerns that you may have at that time regarding the information given to you following your procedure. This is a courtesy call and so if there is no answer at the home number and we have not heard from you through the emergency physician on call, we will assume that you have returned to your regular daily activities without incident.  SIGNATURES/CONFIDENTIALITY: You and/or your care partner have signed paperwork which will be entered into your electronic medical record.  These signatures attest to the fact that that the information above on your After Visit Summary has been reviewed and is understood.  Full responsibility of the confidentiality of   this discharge information lies with you and/or your care-partner.  Polyp-handout given  Repeat colonoscopy will be determined by pathology   

## 2014-02-04 NOTE — Op Note (Signed)
[facility]  [address] [place], [address]   COLONOSCOPY PROCEDURE REPORT  PATIENT: Kathy Lamb, Kathy Lamb  MR#: [id number] BIRTHDATE: [birth year] , 48  yrs. old GENDER: Female ENDOSCOPIST: Milus Banister, MD REFERRED SU:[id number] Sarajane Jews, M.D. PROCEDURE DATE:  02/04/2014 PROCEDURE:   Colonoscopy with snare polypectomy First Screening Colonoscopy - Avg.  risk and is 50 yrs.  old or older Yes.  Prior Negative Screening - Now for repeat screening. N/A  History of Adenoma - Now for follow-up colonoscopy & has been > or = to 3 yrs.  N/A  Polyps Removed Today? Yes. ASA CLASS:   Class II INDICATIONS:average risk screening. MEDICATIONS: MAC sedation, administered by CRNA and propofol (Diprivan) 250mg  IV  DESCRIPTION OF PROCEDURE:   After the risks benefits and alternatives of the procedure were thoroughly explained, informed consent was obtained.  A digital rectal exam revealed no abnormalities of the rectum.   The LB [id number]  endoscope was introduced through the anus and advanced to the cecum, which was identified by both the appendix and ileocecal valve. No adverse events experienced.   The quality of the prep was excellent.  The instrument was then slowly withdrawn as the colon was fully examined.  COLON FINDINGS: One polyp was found, removed and sent to pathology. This was 1.5cm, semipedunculated, located in proximal rectum, removed with snare/cautery.  The examination was otherwise normal. Retroflexed views revealed no abnormalities. The time to cecum=5 minutes 55 seconds.  Withdrawal time=5 minutes 51 seconds.  The scope was withdrawn and the procedure completed. COMPLICATIONS: There were no complications.  ENDOSCOPIC IMPRESSION: One polyp was found, removed and sent to pathology.The examination was otherwise normal.  RECOMMENDATIONS: If the polyp(s) removed today are proven to be adenomatous (pre-cancerous) polyps, you will need a colonoscopy in 3  years. Otherwise you should continue to follow colorectal cancer screening guidelines for "routine risk" patients with a colonoscopy in 10 years.  You will receive a letter within 1-2 weeks with the results of your biopsy as well as final recommendations.  Please call my office if you have not received a letter after 3 weeks.   eSigned:  Milus Banister, MD 02/04/2014 9:11 AM

## 2014-02-04 NOTE — Progress Notes (Signed)
A/ox3 pleased with MAC, report to April RN 

## 2014-02-05 ENCOUNTER — Telehealth: Payer: Self-pay | Admitting: *Deleted

## 2014-02-05 NOTE — Telephone Encounter (Signed)
  Follow up Call-  Call back number 02/04/2014  Post procedure Call Back phone  # [phone]  Permission to leave phone message Yes     Patient questions:  Do you have a fever, pain , or abdominal swelling? No. Pain Score  0 *  Have you tolerated food without any problems? Yes.    Have you been able to return to your normal activities? Yes.    Do you have any questions about your discharge instructions: Diet   No. Medications  No. Follow up visit  no  Do you have questions or concerns about your Care? No.  Actions: * If pain score is 4 or above: No action needed, pain <4.

## 2014-02-12 ENCOUNTER — Encounter: Payer: Self-pay | Admitting: Gastroenterology

## 2014-02-27 ENCOUNTER — Encounter: Payer: BC Managed Care – PPO | Admitting: Obstetrics & Gynecology

## 2014-03-11 ENCOUNTER — Ambulatory Visit (INDEPENDENT_AMBULATORY_CARE_PROVIDER_SITE_OTHER): Payer: BC Managed Care – PPO | Admitting: Obstetrics & Gynecology

## 2014-03-11 ENCOUNTER — Encounter: Payer: Self-pay | Admitting: Obstetrics & Gynecology

## 2014-03-11 VITALS — BP 150/99 | HR 91 | Resp 16 | Ht 64.0 in | Wt 171.0 lb

## 2014-03-11 DIAGNOSIS — Z124 Encounter for screening for malignant neoplasm of cervix: Secondary | ICD-10-CM

## 2014-03-11 DIAGNOSIS — Z0000 Encounter for general adult medical examination without abnormal findings: Secondary | ICD-10-CM | POA: Diagnosis not present

## 2014-03-11 DIAGNOSIS — Z23 Encounter for immunization: Secondary | ICD-10-CM

## 2014-03-11 DIAGNOSIS — Z1151 Encounter for screening for human papillomavirus (HPV): Secondary | ICD-10-CM

## 2014-03-11 DIAGNOSIS — H5500 Unspecified nystagmus: Secondary | ICD-10-CM | POA: Insufficient documentation

## 2014-03-11 MED ORDER — TOPIRAMATE 50 MG PO TABS: 50.0000 mg | ORAL_TABLET | Freq: Every day | ORAL | Status: DC

## 2014-03-11 MED ORDER — INFLUENZA VAC SPLIT QUAD 0.5 ML IM SUSY
0.5000 mL | PREFILLED_SYRINGE | Freq: Once | INTRAMUSCULAR | Status: AC
  Administered 2014-03-11: 0.5 mL via INTRAMUSCULAR

## 2014-03-11 MED ORDER — ALMOTRIPTAN MALATE 12.5 MG PO TABS: ORAL_TABLET | ORAL | Status: DC

## 2014-03-11 NOTE — Progress Notes (Signed)
Patient ID: Kathy Lamb, female   DOB: 08-24-1965, 48 y.o.   MRN: [id number] Subjective:    Kathy Lamb is a 48 y.o. MW G1P1 ( 38 yo daughter) female who presents for an annual exam. The patient has no complaints today. The patient is sexually active. GYN screening history: last pap: was normal. The patient wears seatbelts: yes. The patient participates in regular exercise: yes. Has the patient ever been transfused or tattooed?: no. The patient reports that there is not domestic violence in her life.   Menstrual History: OB History   Grav Para Term Preterm Abortions TAB SAB Ect Mult Living   1 1        1       Menarche age: 33  Patient's last menstrual period was 02/28/2014.    The following portions of the patient's history were reviewed and updated as appropriate: allergies, current medications, past family history, past medical history, past social history, past surgical history and problem list.  Review of Systems A comprehensive review of systems was negative. Married for 26 years, denies dyspareunia. Flu vaccine today. Works in [place], Metallurgist. S/P colonoscopy this year and polyp noted. Will repeat it in 3 years. Mammogram last done about 10 years ago, pap about 15 years ago.   Objective:    BP 150/99  Pulse 91  Resp 16  Ht 5\' 4"  (1.626 m)  Wt 171 lb (77.565 kg)  BMI 29.34 kg/m2  LMP 02/28/2014  General Appearance:    Alert, cooperative, no distress, appears stated age  Head:    Normocephalic, without obvious abnormality, atraumatic  Eyes:    PERRL, conjunctiva/corneas clear, EOM's intact, fundi    benign, both eyes  Ears:    Normal TM's and external ear canals, both ears  Nose:   Nares normal, septum midline, mucosa normal, no drainage    or sinus tenderness  Throat:   Lips, mucosa, and tongue normal; teeth and gums normal  Neck:   Supple, symmetrical, trachea midline, no adenopathy;    thyroid:  no enlargement/tenderness/nodules; no carotid   bruit or JVD   Back:     Symmetric, no curvature, ROM normal, no CVA tenderness  Lungs:     Clear to auscultation bilaterally, respirations unlabored  Chest Wall:    No tenderness or deformity   Heart:    Regular rate and rhythm, S1 and S2 normal, no murmur, rub   or gallop  Breast Exam:    No tenderness, masses, or nipple abnormality  Abdomen:     Soft, non-tender, bowel sounds active all four quadrants,    no masses, no organomegaly  Genitalia:    Normal female without lesion, discharge or tenderness     Extremities:   Extremities normal, atraumatic, no cyanosis or edema  Pulses:   2+ and symmetric all extremities  Skin:   Skin color, texture, turgor normal, no rashes or lesions  Lymph nodes:   Cervical, supraclavicular, and axillary nodes normal  Neurologic:   CNII-XII intact, normal strength, sensation and reflexes    throughout  .    Assessment:    Healthy female exam.    Plan:     Breast self exam technique reviewed and patient encouraged to perform self-exam monthly. Mammogram. Thin prep Pap smear. with cotesting Flu vaccine Rec weight loss Fasting labs at her convenience

## 2014-03-14 LAB — CYTOLOGY - PAP

## 2014-03-24 ENCOUNTER — Encounter: Payer: Self-pay | Admitting: Obstetrics & Gynecology

## 2014-06-27 ENCOUNTER — Emergency Department (INDEPENDENT_AMBULATORY_CARE_PROVIDER_SITE_OTHER)
Admission: EM | Admit: 2014-06-27 | Discharge: 2014-06-27 | Disposition: A | Discharge status: Home / Self Care | Payer: BC Managed Care – PPO | Source: Home / Self Care | Attending: Emergency Medicine | Admitting: Emergency Medicine

## 2014-06-27 ENCOUNTER — Emergency Department (INDEPENDENT_AMBULATORY_CARE_PROVIDER_SITE_OTHER): Payer: BC Managed Care – PPO

## 2014-06-27 ENCOUNTER — Encounter: Payer: Self-pay | Admitting: Emergency Medicine

## 2014-06-27 DIAGNOSIS — L03011 Cellulitis of right finger: Secondary | ICD-10-CM

## 2014-06-27 DIAGNOSIS — M79644 Pain in right finger(s): Secondary | ICD-10-CM

## 2014-06-27 DIAGNOSIS — M7989 Other specified soft tissue disorders: Secondary | ICD-10-CM

## 2014-06-27 MED ORDER — IBUPROFEN 600 MG PO TABS: 600.0000 mg | ORAL_TABLET | Freq: Four times a day (QID) | ORAL | Status: DC | PRN

## 2014-06-27 MED ORDER — DOXYCYCLINE HYCLATE 100 MG PO CAPS: 100.0000 mg | ORAL_CAPSULE | Freq: Two times a day (BID) | ORAL | Status: DC

## 2014-06-27 NOTE — ED Provider Notes (Signed)
CSN: [id number]     Arrival date & time 06/27/14  1522 History   First MD Initiated Contact with Patient 06/27/14 1623     Chief Complaint  Patient presents with  . Hand Pain    HPI 1 day of pain swelling dorsum of right index finger PIP joint area. Intensity 6/10. Character of pain: Sharp Recalls no trauma. No fever or chills or nausea or vomiting. It hurts more to bend or move it or touch it. Improves with rest. No drainage. She also relates that she's had some mild swelling over this area for several weeks but not painful. She squeezed it a couple days ago but nothing came out. Denies fever chills or nausea or vomiting or cardiorespiratory symptoms Past Medical History  Diagnosis Date  . Depression   . Headache(784.0)   . Family history of anesthesia complication     n/v  . History of gestational diabetes   . Migraine headache   . Gallstones   . Blindness of right eye     congenital  . Nystagmus    Past Surgical History  Procedure Laterality Date  . Elbow surgery Left 1974  . Cyst removal leg Left 1995  . Cholecystectomy N/A 02/07/2013    Procedure: LAPAROSCOPIC CHOLECYSTECTOMY WITH INTRAOPERATIVE CHOLANGIOGRAM;  Surgeon: Merrie Roof, MD;  Location: [facility] OR;  Service: General;  Laterality: N/A;   Family History  Problem Relation Age of Onset  . Diabetes Maternal Grandmother   . Colon cancer Maternal Grandmother   . Hypertension    . Lung cancer Father   . Pancreatic cancer Maternal Grandfather     grandfather  . Arthritis Mother   . Osteoporosis Mother   . Rheum arthritis Mother   . Heart disease Mother   . Esophageal cancer Neg Hx   . Rectal cancer Neg Hx   . Stomach cancer Neg Hx    History  Substance Use Topics  . Smoking status: Never Smoker   . Smokeless tobacco: Never Used  . Alcohol Use: Yes     Comment: occ   OB History    Gravida Para Term Preterm AB TAB SAB Ectopic Multiple Living   1 1        1      Review of Systems  All other systems  reviewed and are negative.   Allergies  Norco and Oxycodone  Home Medications   Prior to Admission medications   Medication Sig Start Date End Date Taking? Authorizing Provider  almotriptan (AXERT) 12.5 MG tablet may repeat in 2 hours if needed 03/11/14   Emily Filbert, MD  b complex vitamins tablet Take 1 tablet by mouth daily.    Historical Provider, MD  buPROPion (WELLBUTRIN XL) 300 MG 24 hr tablet TAKE 1 TABLET EVERY MORNING    Laurey Morale, MD  doxycycline (VIBRAMYCIN) 100 MG capsule Take 1 capsule (100 mg total) by mouth 2 (two) times daily. 06/27/14   Jacqulyn Cane, MD  doxycycline (VIBRAMYCIN) 100 MG capsule Take 1 capsule (100 mg total) by mouth 2 (two) times daily. For 10 days 06/27/14   Jacqulyn Cane, MD  fexofenadine (ALLEGRA) 180 MG tablet Take 180 mg by mouth daily as needed (allergies).    Historical Provider, MD  ibuprofen (ADVIL,MOTRIN) 600 MG tablet Take 1 tablet (600 mg total) by mouth every 6 (six) hours as needed. 06/27/14   Jacqulyn Cane, MD  topiramate (TOPAMAX) 50 MG tablet Take 1 tablet (50 mg total) by mouth daily. 03/11/14  Emily Filbert, MD  venlafaxine XR (EFFEXOR-XR) 75 MG 24 hr capsule Take 1 capsule (75 mg total) by mouth daily. 07/04/13   Laurey Morale, MD   BP 160/104 mmHg  Pulse 84  Temp(Src) 98.1 F (36.7 C) (Oral)  Ht 5\' 4"  (1.626 m)  Wt 172 lb (78.019 kg)  BMI 29.51 kg/m2  SpO2 97% Physical Exam  Constitutional: She is oriented to person, place, and time. She appears well-developed and well-nourished. No distress.  HENT:  Head: Normocephalic and atraumatic.  Eyes: Conjunctivae and EOM are normal. Pupils are equal, round, and reactive to light. No scleral icterus.  Neck: Normal range of motion.  Cardiovascular: Normal rate.   Pulmonary/Chest: Effort normal.  Abdominal: She exhibits no distension.  Musculoskeletal: Normal range of motion.  Neurological: She is alert and oriented to person, place, and time.  Skin: Skin is warm.  Psychiatric: She has a  normal mood and affect.  Nursing note and vitals reviewed.  Right index finger: Dorsum of the DIP area is red swollen indurated and very tender .very tender Indurated 4 x 4 millimeter nodular/mass/swelling at the center. No drainage. Decreased range of motion to flexion and extension of the DIP joint. Tendon function grossly intact. No instability. Capillary refill distally and neurovascular distally intact. Fingernail and cuticle normal. ED Course  Procedures (including critical care time) Labs Review Labs Reviewed  WOUND CULTURE    Imaging Review Dg Finger Index Right  06/27/2014   CLINICAL DATA:  Pain and swelling and erythema of the distal interphalangeal joint region of the right index finger for the past 24 hr. There is no known injury.  EXAM: RIGHT INDEX FINGER 2+V  COMPARISON:  None.  FINDINGS: AP, oblique, and lateral views of the right index finger reveal the bones to be adequately mineralized. There is no acute fracture. The interphalangeal joint spaces are preserved. The overlying soft tissues exhibit no abnormal calcifications or gas collections.  IMPRESSION: There is no acute bony abnormality of the right index finger. Mild soft tissue swelling is present.   Electronically Signed   By: David  Martinique   On: 06/27/2014 17:00     MDM   1. Pain in finger of right hand   2. Cellulitis of right index finger   she likely has an infected granuloma over the soft tissue area, dorsum right index finger  Reviewed the above x-ray report and I personally reviewed the x-ray films. On the lateral view of right second finger, dorsal aspect DIP is a small,nonacute appearing irregularity. Interpreted by radiologist as within normal limits, without any bony abnormality.  Reviewed all the above with patient. Treatment options discussed, as well as risks, benefits, alternatives. Patient voiced understanding and agreement with the following plans: Using sterile prep, I used a #11 blade to  perform pinpoint I&D of the nodule. A small amount of blood exuded but no pus. Swabbed for culture. A sterile pressure dressing with good hemostasis. She tolerated procedure well.  Discharge Medication List as of 06/27/2014  5:23 PM    START taking these medications   Details  doxycycline (VIBRAMYCIN) 100 MG capsule Take 1 capsule (100 mg total) by mouth 2 (two) times daily., Starting 06/27/2014, Until Discontinued, Print    ibuprofen (ADVIL,MOTRIN) 600 MG tablet Take 1 tablet (600 mg total) by mouth every 6 (six) hours as needed., Starting 06/27/2014, Until Discontinued, No Print      She declined any prescription pain med. Wound culture pending. Wound care and wrapping/dressing discussed.  advised to followup with hand surgeonhand /specialist if no better in 7-10 days.--names and contact information given. Precautions discussed. Red flags discussed. Questions invited and answered. Patient voiced understanding and agreement.     Jacqulyn Cane, MD 06/27/14 [phone]

## 2014-06-27 NOTE — ED Notes (Signed)
Rt index finger pain since yesterday, red, swollen hurts to bend

## 2014-06-30 LAB — WOUND CULTURE: Gram Stain: NONE SEEN

## 2014-07-02 ENCOUNTER — Telehealth: Payer: Self-pay | Admitting: *Deleted

## 2014-07-02 NOTE — Telephone Encounter (Signed)
-----   Message from Jacqulyn Cane, MD sent at 07/01/2014  8:35 AM EST ----- Wound culture grew out staph aureus, sensitive to doxycycline that was prescribed. Please advise to complete the 10 days of doxycycline. Follow-up with hand specialist if not completely better after 10 days.

## 2014-09-11 ENCOUNTER — Other Ambulatory Visit: Payer: Self-pay | Admitting: Family Medicine

## 2014-10-08 IMAGING — US US ABDOMEN COMPLETE
1 series · 14 of 25 positions shown · non-contrast
Comparison: None.

CLINICAL DATA: Abdominal pain.

COMPLETE ABDOMINAL ULTRASOUND

[Series 1: us abdomen complete · 0.34mm/px · 14 of 40 slices shown]
[im 1/40]
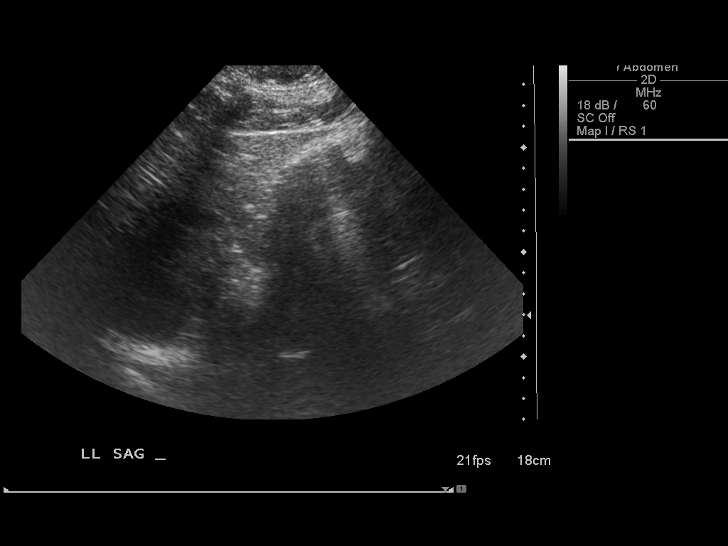
[im 4/40]
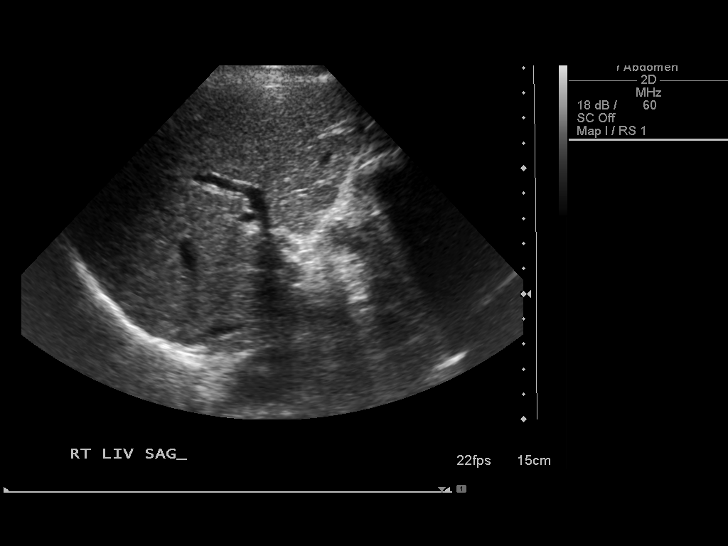
[im 7/40]
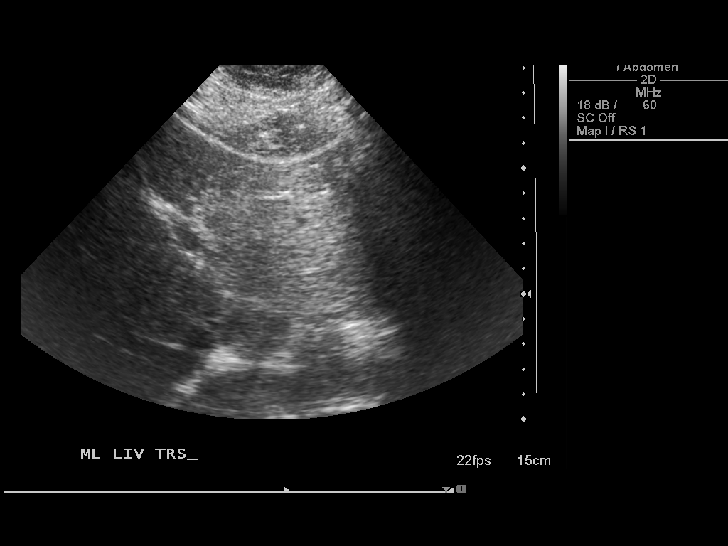
[im 10/40]
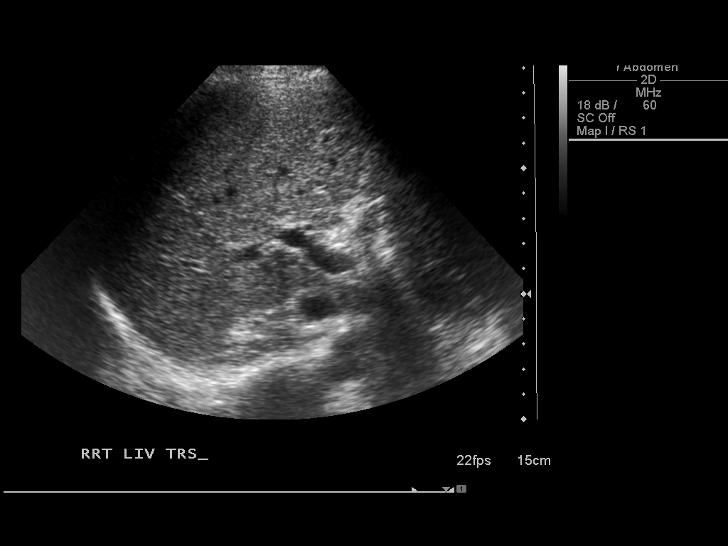
[im 14/40]
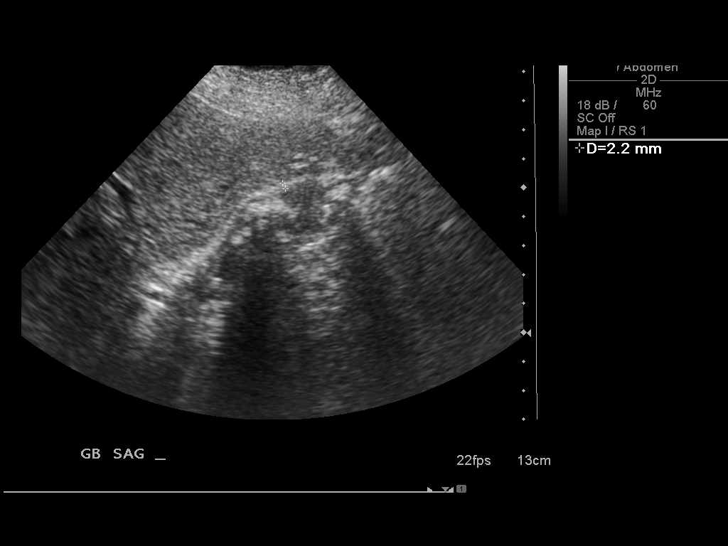
[im 15/40]
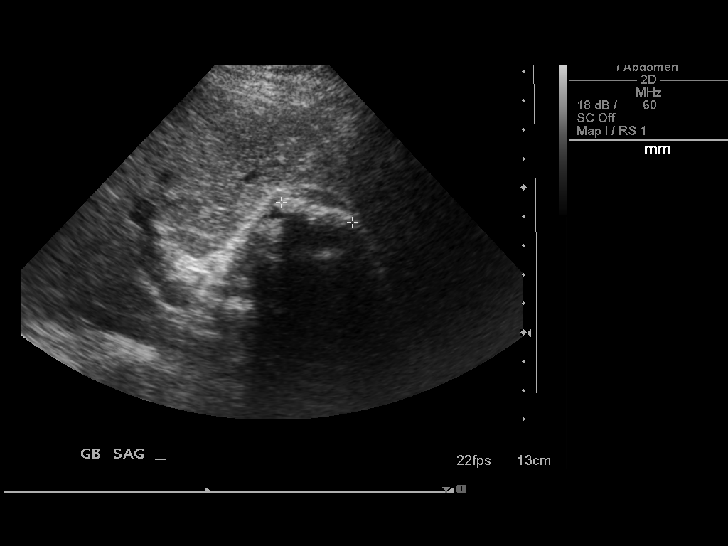
[im 18/40]
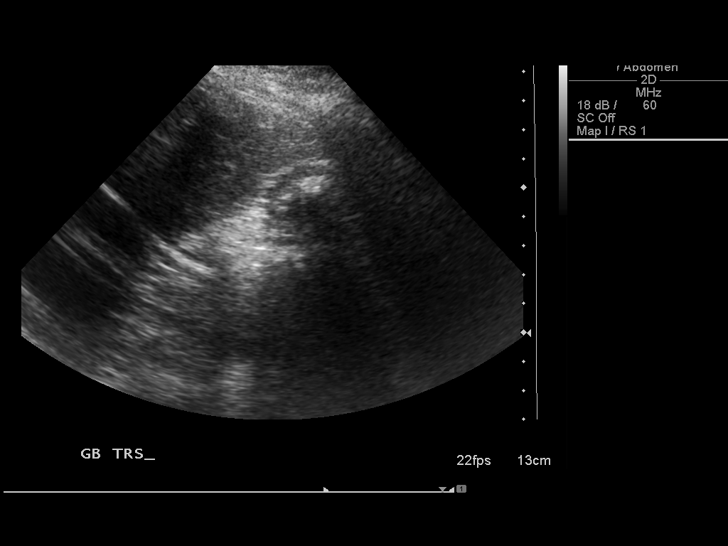
[im 22/40]
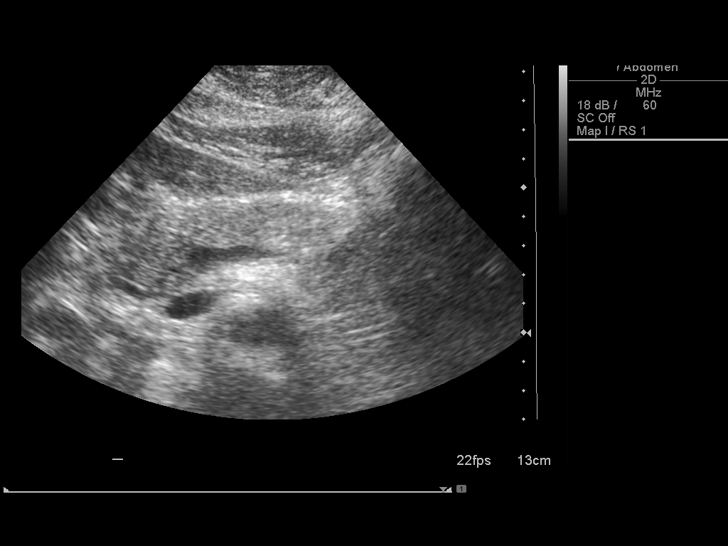
[im 25/40]
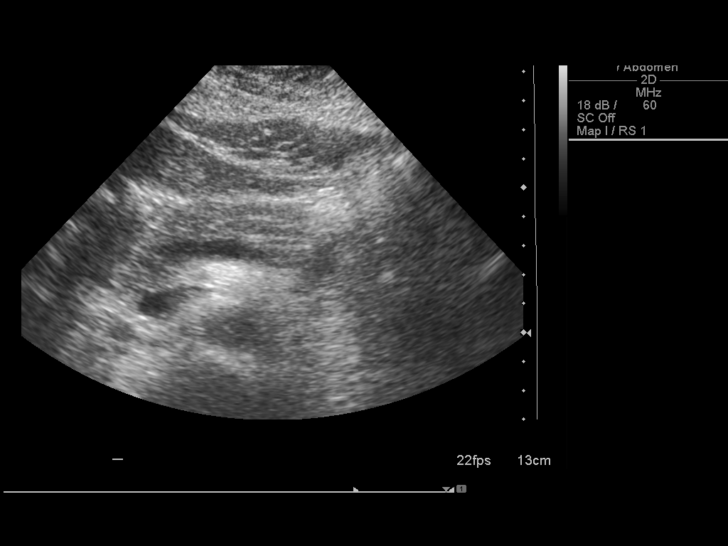
[im 27/40]
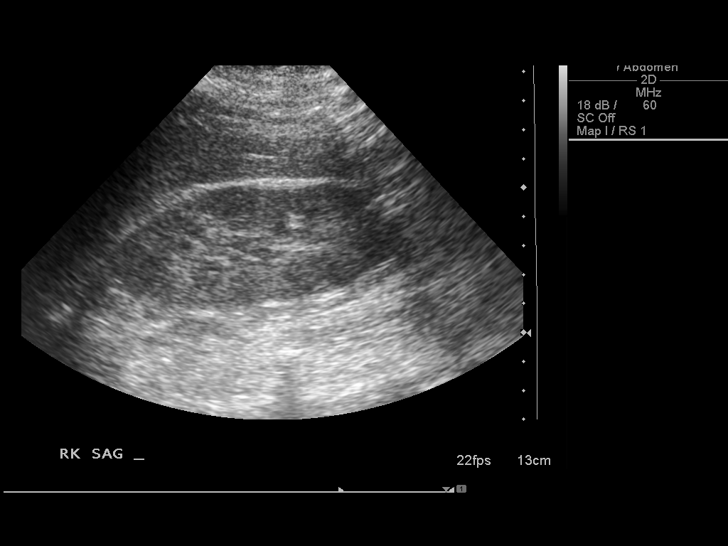
[im 30/40]
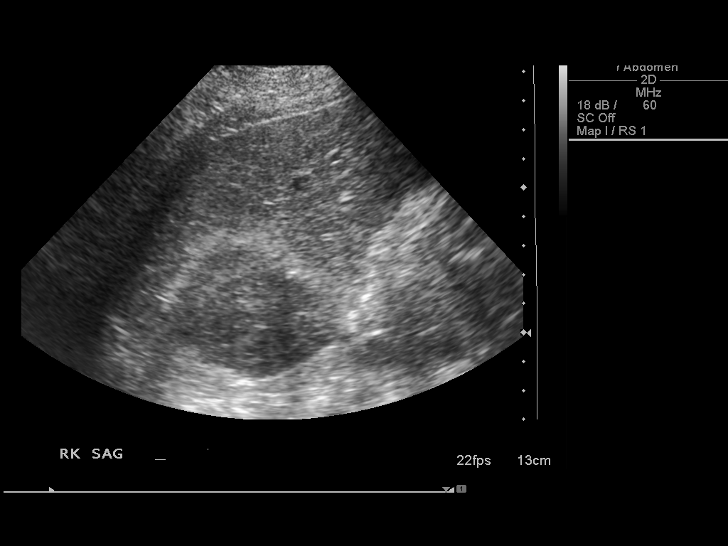
[im 33/40]
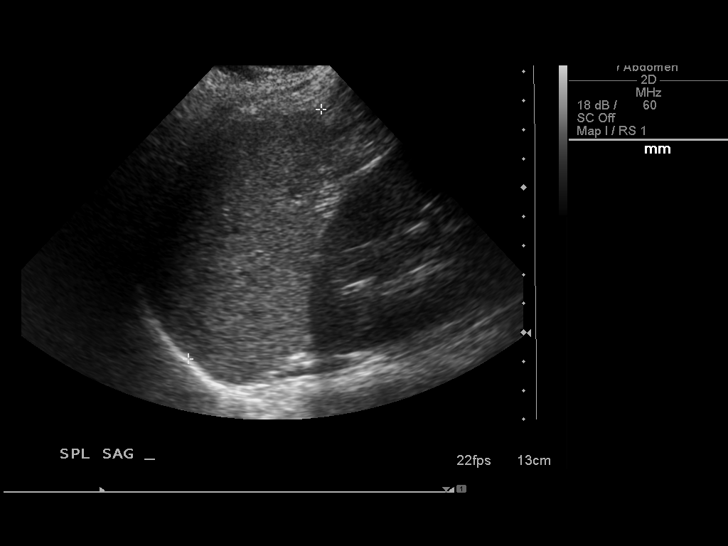
[im 36/40]
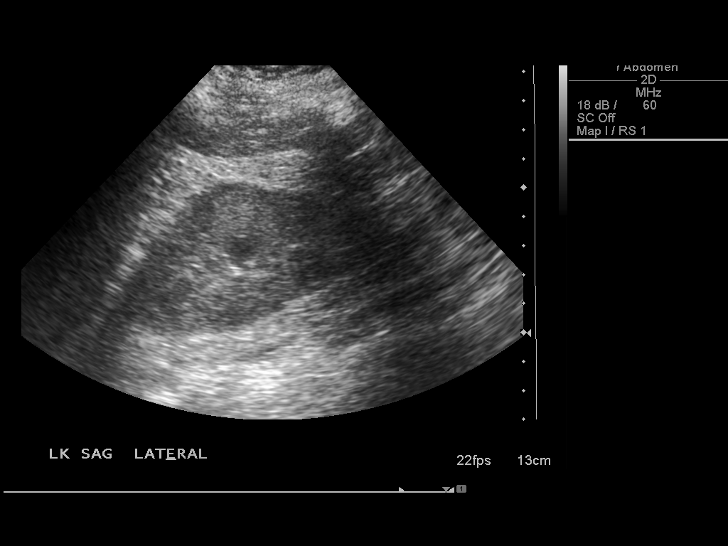
[im 40/40]
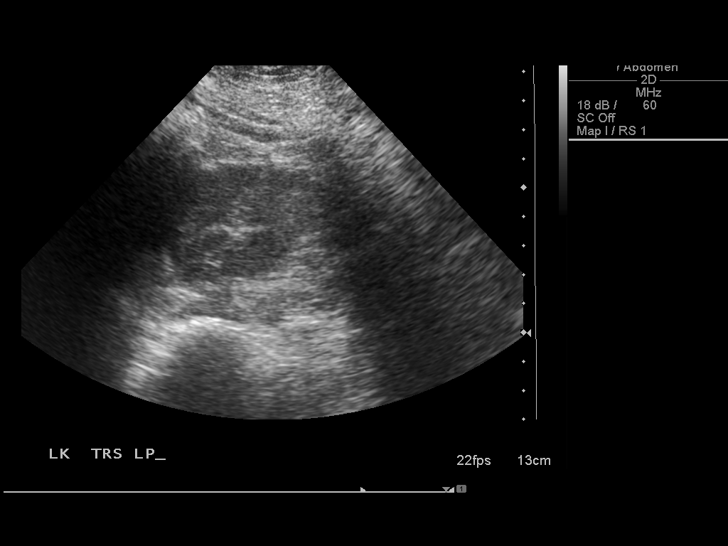

[14 of 25 positions shown; findings below may reference images not displayed]

FINDINGS: Gallbladder:  Multiple stones fill the gallbladder with wall echo
shadow complex.  No gallbladder wall thickening or edema.  Murphy's
sign is negative.

Common bile duct:  No bile duct dilatation.  Diameter measures
mm.

Liver:  No focal lesion identified.  Within normal limits in
parenchymal echogenicity.

IVC:  Appears normal.

Pancreas:  Limited visualization of the pancreas due to overlying
bowel gas.

Spleen:  Spleen length measures 9.8 cm.  Normal parenchymal
echotexture.

Right Kidney:  Right kidney measures 12.2 cm length.  No
hydronephrosis.

Left Kidney:  Left kidney measures 11.7 cm length.  No
hydronephrosis.

Abdominal aorta:  No aneurysm identified.
IMPRESSION: Cholelithiasis with contracted gallbladder.  Visualized upper
abdominal organs are otherwise unremarkable.

## 2014-11-30 ENCOUNTER — Other Ambulatory Visit: Payer: Self-pay | Admitting: Family Medicine

## 2015-02-28 ENCOUNTER — Other Ambulatory Visit: Payer: Self-pay | Admitting: Family Medicine

## 2015-05-29 ENCOUNTER — Other Ambulatory Visit: Payer: Self-pay | Admitting: Family Medicine

## 2015-05-29 NOTE — Telephone Encounter (Signed)
Pt last visit 12/18/2013 Pt last Rx refill 03/02/15 #90   Please advise

## 2015-09-22 ENCOUNTER — Telehealth: Payer: Self-pay | Admitting: General Practice

## 2015-09-22 MED ORDER — VENLAFAXINE HCL ER 75 MG PO CP24: 75.0000 mg | ORAL_CAPSULE | Freq: Every day | ORAL | Status: DC

## 2015-09-22 MED ORDER — BUPROPION HCL ER (XL) 300 MG PO TB24: 300.0000 mg | ORAL_TABLET | Freq: Every morning | ORAL | Status: DC

## 2015-09-22 NOTE — Telephone Encounter (Signed)
Does she want local pharmacy or mail order?

## 2015-09-22 NOTE — Telephone Encounter (Signed)
I sent both scripts e-scribe to mail order pharmacy and pt a my chart message.

## 2015-09-22 NOTE — Telephone Encounter (Signed)
Refill both for one year. 

## 2015-09-28 ENCOUNTER — Telehealth: Payer: Self-pay | Admitting: Family Medicine

## 2015-09-28 NOTE — Telephone Encounter (Signed)
Pt request refill of the following:  buPROPion (WELLBUTRIN XL) 300 MG 24 hr tablet ,    venlafaxine XR (EFFEXOR-XR) 75 MG 24 hr capsule ,    [facility] [employer] [employer] [facility] Pt ask if a few pills can be called until she received her supply from [facility]    Pt said she is out of the above pills

## 2015-09-28 NOTE — Telephone Encounter (Signed)
Per Dr. Sarajane Jews okay to call in a 30 day supply of each. I did call in to [facility] and spoke with pt.

## 2015-10-14 ENCOUNTER — Ambulatory Visit (INDEPENDENT_AMBULATORY_CARE_PROVIDER_SITE_OTHER): Payer: BC Managed Care – PPO | Admitting: Family Medicine

## 2015-10-14 ENCOUNTER — Encounter: Payer: Self-pay | Admitting: Family Medicine

## 2015-10-14 VITALS — BP 151/96 | HR 84 | Temp 98.9°F | Ht 64.0 in | Wt 172.0 lb

## 2015-10-14 DIAGNOSIS — I10 Essential (primary) hypertension: Secondary | ICD-10-CM | POA: Diagnosis not present

## 2015-10-14 DIAGNOSIS — J019 Acute sinusitis, unspecified: Secondary | ICD-10-CM

## 2015-10-14 MED ORDER — LISINOPRIL 10 MG PO TABS: 10.0000 mg | ORAL_TABLET | Freq: Every day | ORAL | Status: DC

## 2015-10-14 MED ORDER — AZITHROMYCIN 250 MG PO TABS: ORAL_TABLET | ORAL | Status: DC

## 2015-10-14 NOTE — Progress Notes (Signed)
Pre visit review using our clinic review tool, if applicable. No additional management support is needed unless otherwise documented below in the visit note. 

## 2015-10-14 NOTE — Progress Notes (Signed)
   Subjective:    Patient ID: Kathy Lamb, female    DOB: January 16, 1966, 50 y.o.   MRN: [id number]  HPI Here for 6 days of sinus pressure, PND, hoarseness, and a dry cough. No fever. Using Ibuprofen, Mucinex, and Allegra with no benefit. Also we note her high BP today. She has been in the 150s or 160s over 90s on her visits here for the past 2 years. She does not use tobacco.    Review of Systems  Constitutional: Negative.   HENT: Positive for congestion, postnasal drip, sinus pressure, sore throat and voice change. Negative for ear pain.   Eyes: Negative.   Respiratory: Positive for cough.   Cardiovascular: Negative.   Neurological: Negative.        Objective:   Physical Exam  Constitutional: She is oriented to person, place, and time. She appears well-developed and well-nourished.  HENT:  Right Ear: External ear normal.  Left Ear: External ear normal.  Nose: Nose normal.  Mouth/Throat: Oropharynx is clear and moist.  Her voice is hoarse   Eyes: Conjunctivae are normal.  Neck: Neck supple. No thyromegaly present.  Cardiovascular: Normal rate, regular rhythm, normal heart sounds and intact distal pulses.   No murmur heard. Pulmonary/Chest: Effort normal and breath sounds normal. No respiratory distress. She has no wheezes. She has no rales.  Musculoskeletal: She exhibits no edema.  Lymphadenopathy:    She has no cervical adenopathy.  Neurological: She is alert and oriented to person, place, and time.          Assessment & Plan:  Treat the sinusitis with a Zpack. Written out of work today and tomorrow. She has HTN and we discussed diet and exercise and reducing her sodium intake. Start on Lisinopril 10 mg daily. Recheck with a cpx and labs in 4 weeks.  Laurey Morale, MD

## 2015-11-11 ENCOUNTER — Ambulatory Visit (INDEPENDENT_AMBULATORY_CARE_PROVIDER_SITE_OTHER): Payer: BC Managed Care – PPO | Admitting: Family Medicine

## 2015-11-11 ENCOUNTER — Encounter: Payer: Self-pay | Admitting: Family Medicine

## 2015-11-11 VITALS — BP 137/93 | HR 87 | Temp 98.2°F | Ht 64.0 in | Wt 168.0 lb

## 2015-11-11 DIAGNOSIS — I10 Essential (primary) hypertension: Secondary | ICD-10-CM | POA: Diagnosis not present

## 2015-11-11 MED ORDER — LISINOPRIL 20 MG PO TABS: 20.0000 mg | ORAL_TABLET | Freq: Every day | ORAL | Status: DC

## 2015-11-11 NOTE — Progress Notes (Signed)
Pre visit review using our clinic review tool, if applicable. No additional management support is needed unless otherwise documented below in the visit note. 

## 2015-11-11 NOTE — Progress Notes (Signed)
   Subjective:    Patient ID: Kathy Lamb, female    DOB: Sep 15, 1965, 50 y.o.   MRN: [id number]  HPI Here to follow up on recently diagnosed HTN. She is taking Lisinopril 10 mg daily and she feels great .    Review of Systems  Constitutional: Negative.   Respiratory: Negative.   Cardiovascular: Negative.   Neurological: Negative.        Objective:   Physical Exam  Constitutional: She is oriented to person, place, and time. She appears well-developed and well-nourished.  Neck: No thyromegaly present.  Cardiovascular: Normal rate, regular rhythm, normal heart sounds and intact distal pulses.   Pulmonary/Chest: Effort normal and breath sounds normal.  Musculoskeletal: She exhibits no edema.  Lymphadenopathy:    She has no cervical adenopathy.  Neurological: She is alert and oriented to person, place, and time.          Assessment & Plan:  HTN which is better but not quite to goal. We will increase the Lisinopril to 20 mg daily. Recheck with a cpx in about 3 months.  Laurey Morale, MD

## 2015-11-18 ENCOUNTER — Ambulatory Visit: Payer: BC Managed Care – PPO | Admitting: Family Medicine

## 2015-11-19 ENCOUNTER — Telehealth: Payer: Self-pay | Admitting: Family Medicine

## 2015-11-19 NOTE — Telephone Encounter (Signed)
Pt is having a cough and thinks that it is related to the Rx linsinopril she would like to know if there is something that can be done.  Pt would like to have call back.

## 2015-11-20 MED ORDER — LOSARTAN POTASSIUM 50 MG PO TABS: 50.0000 mg | ORAL_TABLET | Freq: Every day | ORAL | Status: DC

## 2015-11-20 NOTE — Telephone Encounter (Signed)
Stop the Lisinopril. Instead call in Losartan 50 mg to take daily, #30 with 3 rf. The cough should resolve within a week

## 2015-11-20 NOTE — Telephone Encounter (Signed)
I spoke with pt, sent script e-scribe and added Lisinopril to drug allergy list.

## 2015-12-31 ENCOUNTER — Other Ambulatory Visit: Payer: Self-pay | Admitting: Family Medicine

## 2015-12-31 MED ORDER — FEXOFENADINE HCL 180 MG PO TABS: 180.0000 mg | ORAL_TABLET | Freq: Every day | ORAL | 0 refills | Status: DC | PRN

## 2015-12-31 MED ORDER — BUPROPION HCL ER (XL) 300 MG PO TB24: 300.0000 mg | ORAL_TABLET | Freq: Every morning | ORAL | 0 refills | Status: DC

## 2015-12-31 NOTE — Telephone Encounter (Signed)
Rx's sent to [facility].

## 2015-12-31 NOTE — Telephone Encounter (Signed)
Pt states insurance has changed and she can no longer use [facility].  needs these RX  buPROPion (WELLBUTRIN XL) 300 MG 24 hr tablet fexofenadine (ALLEGRA) 180 MG tablet 90 day supply  Sent to  [facility]/ [address]  This will be pt's new preferred pharmacy

## 2016-01-28 ENCOUNTER — Other Ambulatory Visit: Payer: Self-pay | Admitting: Family Medicine

## 2016-02-02 ENCOUNTER — Other Ambulatory Visit: Payer: Self-pay | Admitting: Family Medicine

## 2016-02-08 ENCOUNTER — Other Ambulatory Visit: Payer: Self-pay | Admitting: Family Medicine

## 2016-02-08 ENCOUNTER — Encounter: Payer: Self-pay | Admitting: Family Medicine

## 2016-02-08 ENCOUNTER — Telehealth: Payer: Self-pay | Admitting: Family Medicine

## 2016-02-08 NOTE — Telephone Encounter (Signed)
Pt needs refill on venlafaxine xr 75 mg #90 w/refills [facility] [address]. Pt is out

## 2016-02-09 MED ORDER — VENLAFAXINE HCL ER 75 MG PO CP24: 75.0000 mg | ORAL_CAPSULE | Freq: Every day | ORAL | 1 refills | Status: DC

## 2016-02-09 NOTE — Telephone Encounter (Signed)
Ok per Dr. Sarajane Jews to send to local pharmacy, pt no longer gets medication with [facility]. I did send in script e-scribe to [facility] and spoke with pt.

## 2016-02-29 ENCOUNTER — Encounter: Payer: Self-pay | Admitting: Family Medicine

## 2016-02-29 ENCOUNTER — Ambulatory Visit (INDEPENDENT_AMBULATORY_CARE_PROVIDER_SITE_OTHER): Payer: BC Managed Care – PPO | Admitting: Family Medicine

## 2016-02-29 VITALS — BP 131/88 | HR 80 | Temp 98.2°F | Ht 64.0 in | Wt 170.0 lb

## 2016-02-29 DIAGNOSIS — Z0000 Encounter for general adult medical examination without abnormal findings: Secondary | ICD-10-CM | POA: Diagnosis not present

## 2016-02-29 LAB — CBC WITH DIFFERENTIAL/PLATELET
BASOS PCT: 0.2 % (ref 0.0–3.0)
Basophils Absolute: 0 10*3/uL (ref 0.0–0.1)
EOS PCT: 1.8 % (ref 0.0–5.0)
Eosinophils Absolute: 0.1 10*3/uL (ref 0.0–0.7)
HEMATOCRIT: 38.8 % (ref 36.0–46.0)
Hemoglobin: 13.1 g/dL (ref 12.0–15.0)
LYMPHS ABS: 1.7 10*3/uL (ref 0.7–4.0)
LYMPHS PCT: 26.4 % (ref 12.0–46.0)
MCHC: 33.8 g/dL (ref 30.0–36.0)
MCV: 87.8 fl (ref 78.0–100.0)
MONOS PCT: 5.7 % (ref 3.0–12.0)
Monocytes Absolute: 0.4 10*3/uL (ref 0.1–1.0)
NEUTROS ABS: 4.2 10*3/uL (ref 1.4–7.7)
NEUTROS PCT: 65.9 % (ref 43.0–77.0)
PLATELETS: 224 10*3/uL (ref 150.0–400.0)
RBC: 4.42 Mil/uL (ref 3.87–5.11)
RDW: 14.1 % (ref 11.5–15.5)
WBC: 6.3 10*3/uL (ref 4.0–10.5)

## 2016-02-29 LAB — POC URINALSYSI DIPSTICK (AUTOMATED)
BILIRUBIN UA: NEGATIVE
GLUCOSE UA: NEGATIVE
Ketones, UA: NEGATIVE
LEUKOCYTES UA: NEGATIVE
Nitrite, UA: NEGATIVE
PH UA: 7
Protein, UA: NEGATIVE
Spec Grav, UA: 1.01
Urobilinogen, UA: 0.2

## 2016-02-29 LAB — BASIC METABOLIC PANEL
BUN: 11 mg/dL (ref 6–23)
CHLORIDE: 107 meq/L (ref 96–112)
CO2: 25 mEq/L (ref 19–32)
Calcium: 9.4 mg/dL (ref 8.4–10.5)
Creatinine, Ser: 0.75 mg/dL (ref 0.40–1.20)
GFR: 86.12 mL/min (ref >60.00)
Glucose, Bld: 91 mg/dL (ref 70–99)
POTASSIUM: 4.8 meq/L (ref 3.5–5.1)
SODIUM: 138 meq/L (ref 135–145)

## 2016-02-29 LAB — HEPATIC FUNCTION PANEL
ALK PHOS: 79 U/L (ref 39–117)
ALT: 17 U/L (ref 0–35)
AST: 15 U/L (ref 0–37)
Albumin: 4.1 g/dL (ref 3.5–5.2)
BILIRUBIN TOTAL: 0.5 mg/dL (ref 0.2–1.2)
Bilirubin, Direct: 0.1 mg/dL (ref 0.0–0.3)
Total Protein: 6.7 g/dL (ref 6.0–8.3)

## 2016-02-29 LAB — LIPID PANEL
CHOL/HDL RATIO: 3
CHOLESTEROL: 215 mg/dL — AB (ref 0–200)
HDL: 74.4 mg/dL (ref >39.00)
LDL Cholesterol: 125 mg/dL — ABNORMAL HIGH (ref 0–99)
NonHDL: 140.22
TRIGLYCERIDES: 78 mg/dL (ref 0.0–149.0)
VLDL: 15.6 mg/dL (ref 0.0–40.0)

## 2016-02-29 LAB — TSH: TSH: 1.39 u[IU]/mL (ref 0.35–4.50)

## 2016-02-29 MED ORDER — BUPROPION HCL ER (XL) 300 MG PO TB24: 300.0000 mg | ORAL_TABLET | Freq: Every morning | ORAL | 3 refills | Status: DC

## 2016-02-29 MED ORDER — AMLODIPINE BESYLATE 5 MG PO TABS: 5.0000 mg | ORAL_TABLET | Freq: Every day | ORAL | 3 refills | Status: DC

## 2016-02-29 MED ORDER — ALMOTRIPTAN MALATE 12.5 MG PO TABS: ORAL_TABLET | ORAL | 5 refills | Status: DC

## 2016-02-29 MED ORDER — TOPIRAMATE 50 MG PO TABS: 50.0000 mg | ORAL_TABLET | Freq: Every day | ORAL | 3 refills | Status: DC

## 2016-02-29 NOTE — Progress Notes (Signed)
   Subjective:    Patient ID: Gevena Lamb, female    DOB: [birth year], 50 y.o.   MRN: [id number]  HPI 50 yr old female for a well exam. She feels well except she still has a dry tickly cough. She has tried both Lisinopril and Losartan and they both make her cough. Otherwise no complaints.    Review of Systems  Constitutional: Negative.   HENT: Negative.   Eyes: Negative.   Respiratory: Negative.   Cardiovascular: Negative.   Gastrointestinal: Negative.   Genitourinary: Negative for decreased urine volume, difficulty urinating, dyspareunia, dysuria, enuresis, flank pain, frequency, hematuria, pelvic pain and urgency.  Musculoskeletal: Negative.   Skin: Negative.   Neurological: Negative.   Psychiatric/Behavioral: Negative.        Objective:   Physical Exam  Constitutional: She is oriented to person, place, and time. She appears well-developed and well-nourished. No distress.  HENT:  Head: Normocephalic and atraumatic.  Right Ear: External ear normal.  Left Ear: External ear normal.  Nose: Nose normal.  Mouth/Throat: Oropharynx is clear and moist. No oropharyngeal exudate.  Eyes: Conjunctivae and EOM are normal. Pupils are equal, round, and reactive to light. No scleral icterus.  Neck: Normal range of motion. Neck supple. No JVD present. No thyromegaly present.  Cardiovascular: Normal rate, regular rhythm, normal heart sounds and intact distal pulses.  Exam reveals no gallop and no friction rub.   No murmur heard. EKG normal   Pulmonary/Chest: Effort normal and breath sounds normal. No respiratory distress. She has no wheezes. She has no rales. She exhibits no tenderness.  Abdominal: Soft. Bowel sounds are normal. She exhibits no distension and no mass. There is no tenderness. There is no rebound and no guarding.  Musculoskeletal: Normal range of motion. She exhibits no edema or tenderness.  Lymphadenopathy:    She has no cervical adenopathy.  Neurological: She is alert and  oriented to person, place, and time. She has normal reflexes. No cranial nerve deficit. She exhibits normal muscle tone. Coordination normal.  Skin: Skin is warm and dry. No rash noted. No erythema.  Psychiatric: She has a normal mood and affect. Her behavior is normal. Judgment and thought content normal.          Assessment & Plan:  Well exam. Get fasting labs. Try Amlodipine for the BP.  Laurey Morale, MD

## 2016-02-29 NOTE — Progress Notes (Signed)
Pre visit review using our clinic review tool, if applicable. No additional management support is needed unless otherwise documented below in the visit note. 

## 2016-06-23 ENCOUNTER — Other Ambulatory Visit: Payer: Self-pay | Admitting: Family Medicine

## 2016-08-02 ENCOUNTER — Other Ambulatory Visit: Payer: Self-pay | Admitting: Family Medicine

## 2016-10-26 ENCOUNTER — Other Ambulatory Visit: Payer: Self-pay | Admitting: Family Medicine

## 2016-10-28 ENCOUNTER — Other Ambulatory Visit: Payer: Self-pay | Admitting: Family Medicine

## 2017-02-09 ENCOUNTER — Encounter: Payer: Self-pay | Admitting: Family Medicine

## 2017-02-27 ENCOUNTER — Encounter: Payer: Self-pay | Admitting: Gastroenterology

## 2017-03-26 ENCOUNTER — Other Ambulatory Visit: Payer: Self-pay | Admitting: Family Medicine

## 2017-03-31 ENCOUNTER — Encounter: Payer: Self-pay | Admitting: Family Medicine

## 2017-03-31 ENCOUNTER — Ambulatory Visit: Payer: BC Managed Care – PPO | Admitting: Family Medicine

## 2017-03-31 VITALS — BP 164/96 | Temp 98.1°F | Ht 64.0 in | Wt 173.0 lb

## 2017-03-31 DIAGNOSIS — I10 Essential (primary) hypertension: Secondary | ICD-10-CM

## 2017-03-31 NOTE — Progress Notes (Signed)
   Subjective:    Patient ID: Kathy Lamb, female    DOB: 1965/11/15, 51 y.o.   MRN: [id number]  HPI Here with concerns about BP. Over the past 2 weeks she has had readings in the 677J and 736K systolic at home. She feels fine but thinks her temper has been quicker than usual.    Review of Systems  Constitutional: Negative.   Respiratory: Negative.   Cardiovascular: Negative.   Neurological: Negative.        Objective:   Physical Exam  Constitutional: She is oriented to person, place, and time. She appears well-developed and well-nourished. No distress.  Neck: No thyromegaly present.  Cardiovascular: Normal rate, regular rhythm, normal heart sounds and intact distal pulses.  Pulmonary/Chest: Effort normal and breath sounds normal. No respiratory distress. She has no wheezes. She has no rales.  Lymphadenopathy:    She has no cervical adenopathy.  Neurological: She is alert and oriented to person, place, and time.          Assessment & Plan:  HTN. We will increase the Amlodipine to 10 mg daily. Recheck in 2 weeks with a complete exam.  Alysia Penna, MD

## 2017-03-31 NOTE — Patient Instructions (Signed)
WE NOW OFFER   Kathy Lamb's FAST TRACK!!!  SAME DAY Appointments for ACUTE CARE  Such as: Sprains, Injuries, cuts, abrasions, rashes, muscle pain, joint pain, back pain Colds, flu, sore throats, headache, allergies, cough, fever  Ear pain, sinus and eye infections Abdominal pain, nausea, vomiting, diarrhea, upset stomach Animal/insect bites  3 Easy Ways to Schedule: Walk-In Scheduling Call in scheduling Mychart Sign-up: [email or web address]         

## 2017-04-17 ENCOUNTER — Encounter: Payer: Self-pay | Admitting: Family Medicine

## 2017-04-17 ENCOUNTER — Ambulatory Visit (INDEPENDENT_AMBULATORY_CARE_PROVIDER_SITE_OTHER): Payer: BC Managed Care – PPO | Admitting: Family Medicine

## 2017-04-17 VITALS — BP 120/80 | HR 81 | Temp 97.9°F | Ht 64.0 in | Wt 170.6 lb

## 2017-04-17 DIAGNOSIS — Z0000 Encounter for general adult medical examination without abnormal findings: Secondary | ICD-10-CM

## 2017-04-17 LAB — POC URINALSYSI DIPSTICK (AUTOMATED)
Glucose, UA: NEGATIVE
KETONES UA: NEGATIVE
LEUKOCYTES UA: NEGATIVE
Nitrite, UA: NEGATIVE
PH UA: 5.5 (ref 5.0–8.0)
PROTEIN UA: NEGATIVE
SPEC GRAV UA: 1.025 (ref 1.010–1.025)
Urobilinogen, UA: 0.2 E.U./dL

## 2017-04-17 MED ORDER — AMLODIPINE BESYLATE 5 MG PO TABS: 5.0000 mg | ORAL_TABLET | Freq: Every day | ORAL | 3 refills | Status: DC

## 2017-04-17 MED ORDER — ALMOTRIPTAN MALATE 12.5 MG PO TABS: ORAL_TABLET | ORAL | 3 refills | Status: DC

## 2017-04-17 MED ORDER — TOPIRAMATE 50 MG PO TABS: 50.0000 mg | ORAL_TABLET | Freq: Every day | ORAL | 3 refills | Status: DC

## 2017-04-17 NOTE — Progress Notes (Signed)
   Subjective:    Patient ID: Kathy Lamb, female    DOB: 06/02/1965, 51 y.o.   MRN: [id number]  HPI Here for a well exam. She feels well and has no complaints.    Review of Systems  Constitutional: Negative.   HENT: Negative.   Eyes: Negative.   Respiratory: Negative.   Cardiovascular: Negative.   Gastrointestinal: Negative.   Genitourinary: Negative for decreased urine volume, difficulty urinating, dyspareunia, dysuria, enuresis, flank pain, frequency, hematuria, pelvic pain and urgency.  Musculoskeletal: Negative.   Skin: Negative.   Neurological: Negative.   Psychiatric/Behavioral: Negative.        Objective:   Physical Exam  Constitutional: She is oriented to person, place, and time. She appears well-developed and well-nourished. No distress.  HENT:  Head: Normocephalic and atraumatic.  Right Ear: External ear normal.  Left Ear: External ear normal.  Nose: Nose normal.  Mouth/Throat: Oropharynx is clear and moist. No oropharyngeal exudate.  Eyes: Conjunctivae and EOM are normal. Pupils are equal, round, and reactive to light. No scleral icterus.  Neck: Normal range of motion. Neck supple. No JVD present. No thyromegaly present.  Cardiovascular: Normal rate, regular rhythm, normal heart sounds and intact distal pulses. Exam reveals no gallop and no friction rub.  No murmur heard. Pulmonary/Chest: Effort normal and breath sounds normal. No respiratory distress. She has no wheezes. She has no rales. She exhibits no tenderness.  Abdominal: Soft. Bowel sounds are normal. She exhibits no distension and no mass. There is no tenderness. There is no rebound and no guarding.  Musculoskeletal: Normal range of motion. She exhibits no edema or tenderness.  Lymphadenopathy:    She has no cervical adenopathy.  Neurological: She is alert and oriented to person, place, and time. She has normal reflexes. No cranial nerve deficit. She exhibits normal muscle tone. Coordination normal.    Skin: Skin is warm and dry. No rash noted. No erythema.  Psychiatric: She has a normal mood and affect. Her behavior is normal. Judgment and thought content normal.          Assessment & Plan:  Well exam. We discussed diet and exercise. Get fasting labs.  Alysia Penna, MD

## 2017-04-18 LAB — CBC WITH DIFFERENTIAL/PLATELET
BASOS PCT: 0.9 % (ref 0.0–3.0)
Basophils Absolute: 0.1 10*3/uL (ref 0.0–0.1)
EOS PCT: 2.6 % (ref 0.0–5.0)
Eosinophils Absolute: 0.2 10*3/uL (ref 0.0–0.7)
HCT: 39.9 % (ref 36.0–46.0)
Hemoglobin: 13.1 g/dL (ref 12.0–15.0)
Lymphocytes Relative: 34.8 % (ref 12.0–46.0)
Lymphs Abs: 2.4 10*3/uL (ref 0.7–4.0)
MCHC: 32.9 g/dL (ref 30.0–36.0)
MCV: 89.2 fl (ref 78.0–100.0)
MONO ABS: 0.5 10*3/uL (ref 0.1–1.0)
Monocytes Relative: 6.8 % (ref 3.0–12.0)
NEUTROS ABS: 3.9 10*3/uL (ref 1.4–7.7)
Neutrophils Relative %: 54.9 % (ref 43.0–77.0)
PLATELETS: 239 10*3/uL (ref 150.0–400.0)
RBC: 4.47 Mil/uL (ref 3.87–5.11)
RDW: 13.6 % (ref 11.5–15.5)
WBC: 7 10*3/uL (ref 4.0–10.5)

## 2017-04-18 LAB — HEPATIC FUNCTION PANEL
ALK PHOS: 103 U/L (ref 39–117)
ALT: 18 U/L (ref 0–35)
AST: 17 U/L (ref 0–37)
Albumin: 4.7 g/dL (ref 3.5–5.2)
BILIRUBIN DIRECT: 0.1 mg/dL (ref 0.0–0.3)
TOTAL PROTEIN: 7.3 g/dL (ref 6.0–8.3)
Total Bilirubin: 0.5 mg/dL (ref 0.2–1.2)

## 2017-04-18 LAB — BASIC METABOLIC PANEL
BUN: 15 mg/dL (ref 6–23)
CHLORIDE: 104 meq/L (ref 96–112)
CO2: 27 meq/L (ref 19–32)
Calcium: 10.1 mg/dL (ref 8.4–10.5)
Creatinine, Ser: 0.86 mg/dL (ref 0.40–1.20)
GFR: 73.22 mL/min (ref >60.00)
Glucose, Bld: 82 mg/dL (ref 70–99)
POTASSIUM: 3.8 meq/L (ref 3.5–5.1)
SODIUM: 140 meq/L (ref 135–145)

## 2017-04-18 LAB — LIPID PANEL
CHOL/HDL RATIO: 3
CHOLESTEROL: 227 mg/dL — AB (ref 0–200)
HDL: 76.4 mg/dL (ref >39.00)
LDL Cholesterol: 129 mg/dL — ABNORMAL HIGH (ref 0–99)
NonHDL: 150.58
TRIGLYCERIDES: 108 mg/dL (ref 0.0–149.0)
VLDL: 21.6 mg/dL (ref 0.0–40.0)

## 2017-04-18 LAB — TSH: TSH: 1.63 u[IU]/mL (ref 0.35–4.50)

## 2017-04-20 ENCOUNTER — Telehealth: Payer: Self-pay | Admitting: Family Medicine

## 2017-04-20 NOTE — Telephone Encounter (Signed)
Copied from [place]. Topic: Quick Communication - Rx Refill/Question >> Apr 20, 2017  4:35 PM Robina Ade, Helene Kelp D wrote: CRM for notification. See Telephone encounter for: 04/20/17. Patient needs refill on her amLODipine (NORVASC) 10 MG tablet with a 90 day supply sent to [facility], [address] AT [place] Has the patient contacted their pharmacy? Yes (Agent: If no, request that the patient contact the pharmacy for the refill.) Preferred Pharmacy (with phone number or street name): [facility], [address] AT [place] Agent: Please be advised that RX refills may take up to 48 hours. We ask that you follow-up with your pharmacy.

## 2017-04-21 MED ORDER — AMLODIPINE BESYLATE 10 MG PO TABS
10.0000 mg | ORAL_TABLET | Freq: Every day | ORAL | 3 refills | Status: DC
Start: 2017-04-21 — End: 2018-01-31

## 2017-04-21 NOTE — Telephone Encounter (Signed)
°  Relation to pt: self  Call back number: [phone]  Pharmacy: [facility], [address] AT [place] [phone] (Phone) [phone] (Fax)     Reason for call:  Patient checking on the status of 90 day supply for 10 MG amLODipine (NORVASC) , please advise

## 2017-04-21 NOTE — Addendum Note (Signed)
Addended by: Myriam Forehand on: 04/21/2017 05:14 PM   Modules accepted: Orders

## 2017-04-21 NOTE — Telephone Encounter (Signed)
Called patient and left message to check with her pharmacy regarding her refill.

## 2017-04-21 NOTE — Telephone Encounter (Signed)
FYI pt stated that at her last OV that she had with you. The both of you discuss uping he dose to the 10 MG rather than the 5 MG dose  Rx was sent to her pharmacy for the 5 MG dose. II removed the 5 MG RX due to dose change. Sent in correct Rx with dose change.

## 2017-05-11 ENCOUNTER — Other Ambulatory Visit: Payer: Self-pay | Admitting: Family Medicine

## 2017-06-09 ENCOUNTER — Encounter: Payer: Self-pay | Admitting: Family Medicine

## 2017-06-12 NOTE — Telephone Encounter (Signed)
She needs an OV to evalulate all this

## 2017-06-16 ENCOUNTER — Encounter: Payer: Self-pay | Admitting: Family Medicine

## 2017-06-16 ENCOUNTER — Ambulatory Visit: Payer: BC Managed Care – PPO | Admitting: Family Medicine

## 2017-06-16 VITALS — BP 120/82 | HR 87 | Temp 97.8°F | Wt 169.6 lb

## 2017-06-16 DIAGNOSIS — M7661 Achilles tendinitis, right leg: Secondary | ICD-10-CM

## 2017-06-16 NOTE — Progress Notes (Signed)
   Subjective:    Patient ID: Kathy Lamb, female    DOB: November 15, 1965, 52 y.o.   MRN: [id number]  HPI Here for 2 months of pain in the back of the right heel. No hx of trauma. She is not a runner. No recent use of quinolone antibiotics.    Review of Systems  Constitutional: Negative.   Respiratory: Negative.   Cardiovascular: Negative.   Musculoskeletal: Positive for arthralgias.       Objective:   Physical Exam  Constitutional: She is oriented to person, place, and time. She appears well-developed and well-nourished.  Cardiovascular: Normal rate, regular rhythm, normal heart sounds and intact distal pulses.  Pulmonary/Chest: Effort normal and breath sounds normal.  Musculoskeletal:  Tender over the insertion of the right Achilles tendon onto the heel. No swelling or warmth   Neurological: She is alert and oriented to person, place, and time.          Assessment & Plan:  Achilles tendonitis. Rest, wear supportive shoes, apply ice. Refer to Sports Medicine.  Alysia Penna, MD

## 2017-06-21 ENCOUNTER — Encounter: Payer: Self-pay | Admitting: Sports Medicine

## 2017-06-21 ENCOUNTER — Ambulatory Visit: Payer: Self-pay

## 2017-06-21 ENCOUNTER — Ambulatory Visit: Payer: BC Managed Care – PPO | Admitting: Sports Medicine

## 2017-06-21 VITALS — BP 130/86 | HR 87 | Ht 64.0 in | Wt 170.4 lb

## 2017-06-21 DIAGNOSIS — M7660 Achilles tendinitis, unspecified leg: Secondary | ICD-10-CM

## 2017-06-21 DIAGNOSIS — M6788 Other specified disorders of synovium and tendon, other site: Secondary | ICD-10-CM

## 2017-06-21 NOTE — Patient Instructions (Signed)
Please perform the exercise program that we have prepared for you and gone over in detail on a daily basis.  In addition to the handout you were provided you can access your program through: [email or web address]   Your unique program code is: [email or web address]  I recommend you obtained a compression sleeve to help with your joint problems. There are many options on the market however I recommend obtaining a R ankle Body Helix compression sleeve.  You can find information (including how to appropriate measure yourself for sizing) can be found at www.Body [email or web address]/.  Many of these products are health savings account (HSA) eligible.   You can use the compression sleeve at any time throughout the day but is most important to use while being active as well as for 2 hours post-activity.   It is appropriate to ice following activity with the compression sleeve in place.

## 2017-06-21 NOTE — Progress Notes (Signed)
Kathy Lamb. [place], [place] at [place]  PLEAS[facility] [place] - 52 y.o. female MRN [id number]  Date of birth: Oct 20, 1965  Visit Date: 06/21/2017  PCP: Laurey Morale, MD   Referred by: Laurey Morale, MD   Scribe for today's visit: Wendy Poet, LAT, ATC     SUBJECTIVE:  Kathy Lamb is here for New Patient (Initial Visit) (R Achille's and heel pain) .  Referred by: Dr. Sarajane Jews Her R Achille's symptoms INITIALLY: Began approximately 2 months ago (Nov. 2018) w/ no MOI Described as 7/10 sharp pain at it's worst and 0/10 pain at rest, nonradiating Worsened with walking, palpation  Improved with nothing stated Additional associated symptoms include: no radiating pain and no N/T noted in the R LE or foot    At this time symptoms are worsening compared to onset w/ increased pain and swelling noted in the R foot. She has been icing, using IBU, using a compression wrap w/ little-no improvement.   ROS Denies night time disturbances. Denies fevers, chills, or night sweats. Denies unexplained weight loss. Denies personal history of cancer. Denies changes in bowel or bladder habits. Denies recent unreported falls. Denies new or worsening dyspnea or wheezing. Denies headaches or dizziness.  Reports numbness, tingling or weakness  In the extremities, specifically weakness in the L upper arm. Denies dizziness or presyncopal episodes Reports lower extremity edema in the B feet (R>L).    HISTORY & PERTINENT PRIOR DATA:  Prior History reviewed and updated per electronic medical record.  Significant history, findings, studies and interim changes include:  reports that  has never smoked. she has never used smokeless tobacco. No results for input(s): HGBA1C, LABURIC, CREATINE in the last 8760 hours. No specialty comments available. Problem  Achilles Tendinosis    OBJECTIVE:  VS:  HT:5\' 4"  (162.6 cm)   WT:170 lb 6.4 oz (77.3 kg)   BMI:29.23    BP:130/86  HR:87bpm  TEMP: ( )  RESP:96 %   PHYSICAL EXAM: Constitutional: WDWN, Non-toxic appearing. Psychiatric: Alert & appropriately interactive.  Not depressed or anxious appearing. Respiratory: No increased work of breathing.  Trachea Midline Eyes: Pupils are equal.  EOM intact without nystagmus.  No scleral icterus  NEUROVASCULAR exam: No clubbing or cyanosis appreciated No significant venous stasis changes Capillary Refill: normal, less than 2 seconds   LOWER Extremities  Pre-tibial edema: No significant pretibial edema Pedal Pulses: Normal & symmetrically palpable Dermatomes intact to light touch Normal strength in all myotomes Reflexes: Normal & symmetric DTRs   Bilateral ankles and feet overall well aligned. She has marked swelling across the posterior aspect of the right Achilles and marked pain with Achilles squeeze test as well as painful dorsiflexion. No additional findings.   ASSESSMENT & PLAN:   1. 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  There is a marked degree of neovascularity. With her migraine history can result in quite debilitating migraines further treatment with nitroglycerin is not indicated.  We will have her begin on Alfredson exercises compression, ice soaking and I cautioned on increased risk of rupture.  Follow-up in 6 weeks to ensure clinical improvement.  And reevaluate with ultrasound   ++++++++++++++++++++++++++++++++++++++++++++ Orders & Meds: Orders Placed This Encounter  Procedures  . Misc procedure  . [place] MSK POCT ULTRASOUND    No orders of the defined  types were placed in this encounter.   ++++++++++++++++++++++++++++++++++++++++++++ Follow-up: Return in about 6 weeks (around 08/02/2017).   Pertinent documentation may be included in additional procedure notes, imaging studies,  problem based documentation and patient instructions. Please see these sections of the encounter for additional information regarding this visit. CMA/ATC served as Education administrator during this visit. History, Physical, and Plan performed by medical provider. Documentation and orders reviewed and attested to.      Gerda Diss, [place] [facility] Physician

## 2017-06-22 ENCOUNTER — Encounter: Payer: Self-pay | Admitting: Sports Medicine

## 2017-06-22 DIAGNOSIS — M6788 Other specified disorders of synovium and tendon, other site: Secondary | ICD-10-CM | POA: Insufficient documentation

## 2017-06-22 NOTE — Procedures (Signed)
LIMITED MSK ULTRASOUND OF bilateral Achilles Images were obtained and interpreted by myself, Teresa Coombs, DO  Images have been saved and stored to PACS system. Images obtained on: GE S7 Ultrasound machine  FINDINGS:   Right Achilles tendon is markedly swollen at the avascular zone with fusiform type swelling and increased hypoechoic change.  Maximal diameter is 1.40 cm  IMPRESSION:  1. Marked Achilles tendinosis with both acute and chronic components.

## 2017-06-22 NOTE — Assessment & Plan Note (Signed)
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  There is a marked degree of neovascularity. With her migraine history can result in quite debilitating migraines further treatment with nitroglycerin is not indicated.  We will have her begin on Alfredson exercises compression, ice soaking and I cautioned on increased risk of rupture.  Follow-up in 6 weeks to ensure clinical improvement.  And reevaluate with ultrasound

## 2017-06-22 NOTE — Procedures (Signed)

## 2017-08-02 ENCOUNTER — Encounter: Payer: Self-pay | Admitting: Sports Medicine

## 2017-08-02 ENCOUNTER — Ambulatory Visit: Payer: BC Managed Care – PPO | Admitting: Sports Medicine

## 2017-08-02 VITALS — BP 116/80 | HR 83 | Ht 64.0 in | Wt 173.4 lb

## 2017-08-02 DIAGNOSIS — M6788 Other specified disorders of synovium and tendon, other site: Secondary | ICD-10-CM

## 2017-08-02 DIAGNOSIS — I10 Essential (primary) hypertension: Secondary | ICD-10-CM | POA: Diagnosis not present

## 2017-08-02 DIAGNOSIS — M7660 Achilles tendinitis, unspecified leg: Secondary | ICD-10-CM

## 2017-08-02 NOTE — Progress Notes (Signed)
  Kathy Lamb, Kathy Lamb at [place]  Kathy Lamb - 52 y.o. female MRN [id number]  Date of birth: 03/14/1966  Visit Date: 08/02/2017  PCP: Kathy Morale, MD   Referred by: Kathy Morale, MD   Scribe for today's visit: Kathy Lamb, CMA     SUBJECTIVE:  Kathy Lamb is here for Follow-up (R achilles pain)  06/21/17: Her R Achille's symptoms INITIALLY: Began approximately 2 months ago (Nov. 2018) w/ no MOI Described as 7/10 sharp pain at it's worst and 0/10 pain at rest, nonradiating Worsened with walking, palpation  Improved with nothing stated Additional associated symptoms include: no radiating pain and no N/T noted in the R LE or foot At this time symptoms are worsening compared to onset w/ increased pain and swelling noted in the R foot. She has been icing, using IBU, using a compression wrap w/ little-no improvement.  08/02/17: Compared to the last office visit, her previously described symptoms are improving, less severe and less frequent.  Current symptoms are mild & are nonradiating She has been doing home exercises with no trouble, she feels like she has benefited from these. She has been wearing Body Helix compression sleeve just as needed, she has not noticed any blistering.   She reports that she was walking up stairs 2 weeks ago and se felt a pop in her lower leg and then the pain eased off. There was no pain when she felt the pop.    ROS Denies night time disturbances. Denies fevers, chills, or night sweats. Denies unexplained weight loss. Denies personal history of cancer. Denies changes in bowel or bladder habits. Denies recent unreported falls. Denies new or worsening dyspnea or wheezing. Denies headaches or dizziness.  Denies numbness, tingling or weakness  In the extremities.  Denies dizziness or presyncopal episodes Reports lower extremity edema    HISTORY & PERTINENT PRIOR DATA:    Prior History reviewed and updated per electronic medical record.  Significant history, findings, studies and interim changes include:  reports that she has never smoked. She has never used smokeless tobacco. No results for input(s): HGBA1C, LABURIC, CREATINE in the last 8760 hours. No specialty comments available. No problems updated.     Please see additional documentation for Objective, Assessment and Plan sections. Pertinent additional documentation may be included in corresponding procedure notes, imaging studies, problem based documentation and patient instructions. Please see these sections of the encounter for additional information regarding this visit.  CMA/ATC served as Education administrator during this visit. History, Physical, and Plan performed by medical provider. Documentation and orders reviewed and attested to.      Gerda Diss, [place] [facility] Physician

## 2017-08-02 NOTE — Progress Notes (Signed)
  OBJECTIVE:  VS:  HT:5\' 4"  (162.6 cm)   WT:173 lb 6.4 oz (78.7 kg)  BMI:29.75    BP:116/80  HR:83bpm  TEMP: ( )  RESP:96 %   PHYSICAL EXAM: WDWN, Non-toxic appearing. Psychiatric: Alert & appropriately interactive.  Not depressed or anxious appearing. Respiratory: No increased work of breathing.  Trachea Midline Eyes: Pupils are equal.  EOM intact without nystagmus.  No scleral icterus  NEUROVASCULAR exam: No clubbing or cyanosis appreciated No significant venous stasis changes normal, less than 2 seconds   Lower extremities overall well aligned without significant deformity.  She does have a small amount of swelling within the avascular zone of the Achilles and there is pain with direct palpation of this region.  There is pain with passive dorsiflexion.  Pain with Achilles squeeze but normal Thomas test.  ASSESSMENT & PLAN:   1. Achilles tendinosis   2. Essential hypertension    Orders & Meds: No orders of the defined types were placed in this encounter.  No orders of the defined types were placed in this encounter.   PLAN: Symptoms are consistent with Achilles tendinosis.  We discussed using compression as well as eccentric therapeutic exercises.  We did discuss using nitroglycerin patches but will defer this at this time.  Risks of potential rupture were also discussed and she will plan to follow-up if any worsening symptoms but reports mainly  looking for reassurance today.  Follow-up: No follow-ups on file.

## 2017-08-11 ENCOUNTER — Other Ambulatory Visit: Payer: Self-pay | Admitting: Family Medicine

## 2017-09-15 ENCOUNTER — Encounter: Payer: Self-pay | Admitting: Gastroenterology

## 2017-09-20 ENCOUNTER — Other Ambulatory Visit: Payer: Self-pay | Admitting: Family Medicine

## 2017-09-20 DIAGNOSIS — Z1231 Encounter for screening mammogram for malignant neoplasm of breast: Secondary | ICD-10-CM

## 2017-09-27 ENCOUNTER — Encounter: Payer: Self-pay | Admitting: Sports Medicine

## 2017-10-23 ENCOUNTER — Ambulatory Visit
Admission: RE | Admit: 2017-10-23 | Discharge: 2017-10-23 | Disposition: A | Discharge status: Home / Self Care | Payer: BC Managed Care – PPO | Source: Ambulatory Visit | Attending: Family Medicine | Admitting: Family Medicine

## 2017-10-23 DIAGNOSIS — Z1231 Encounter for screening mammogram for malignant neoplasm of breast: Secondary | ICD-10-CM

## 2017-11-17 ENCOUNTER — Ambulatory Visit (AMBULATORY_SURGERY_CENTER): Payer: Self-pay

## 2017-11-17 ENCOUNTER — Other Ambulatory Visit: Payer: Self-pay

## 2017-11-17 VITALS — Ht 64.0 in | Wt 169.0 lb

## 2017-11-17 DIAGNOSIS — Z86010 Personal history of colonic polyps: Secondary | ICD-10-CM

## 2017-11-17 MED ORDER — PEG 3350-KCL-NA BICARB-NACL 420 G PO SOLR: 4000.0000 mL | Freq: Once | ORAL | 0 refills | Status: AC

## 2017-11-17 NOTE — Progress Notes (Signed)
Denies allergies to eggs or soy products. Denies complication of anesthesia or sedation. Denies use of weight loss medication. Denies use of O2.   Emmi instructions declined.  

## 2017-11-20 ENCOUNTER — Encounter: Payer: Self-pay | Admitting: Gastroenterology

## 2017-12-01 ENCOUNTER — Encounter: Payer: Self-pay | Admitting: Gastroenterology

## 2017-12-01 ENCOUNTER — Ambulatory Visit (AMBULATORY_SURGERY_CENTER): Payer: BC Managed Care – PPO | Admitting: Gastroenterology

## 2017-12-01 VITALS — BP 121/84 | HR 75 | Temp 98.7°F | Resp 18 | Ht 64.0 in | Wt 173.0 lb

## 2017-12-01 DIAGNOSIS — D128 Benign neoplasm of rectum: Secondary | ICD-10-CM

## 2017-12-01 DIAGNOSIS — K621 Rectal polyp: Secondary | ICD-10-CM | POA: Diagnosis not present

## 2017-12-01 DIAGNOSIS — Z86010 Personal history of colonic polyps: Secondary | ICD-10-CM | POA: Diagnosis not present

## 2017-12-01 HISTORY — PX: COLONOSCOPY: SHX174

## 2017-12-01 MED ORDER — SODIUM CHLORIDE 0.9 % IV SOLN: 500.0000 mL | INTRAVENOUS | Status: DC

## 2017-12-01 NOTE — Progress Notes (Signed)
Report given to PACU, vss 

## 2017-12-01 NOTE — Patient Instructions (Signed)
Information on polyps given.   YOU HAD AN ENDOSCOPIC PROCEDURE TODAY AT THE [facility]:   Refer to the procedure report that was given to you for any specific questions about what was found during the examination.  If the procedure report does not answer your questions, please call your gastroenterologist to clarify.  If you requested that your care partner not be given the details of your procedure findings, then the procedure report has been included in a sealed envelope for you to review at your convenience later.  YOU SHOULD EXPECT: Some feelings of bloating in the abdomen. Passage of more gas than usual.  Walking can help get rid of the air that was put into your GI tract during the procedure and reduce the bloating. If you had a lower endoscopy (such as a colonoscopy or flexible sigmoidoscopy) you may notice spotting of blood in your stool or on the toilet paper. If you underwent a bowel prep for your procedure, you may not have a normal bowel movement for a few days.  Please Note:  You might notice some irritation and congestion in your nose or some drainage.  This is from the oxygen used during your procedure.  There is no need for concern and it should clear up in a day or so.  SYMPTOMS TO REPORT IMMEDIATELY:   Following lower endoscopy (colonoscopy or flexible sigmoidoscopy):  Excessive amounts of blood in the stool  Significant tenderness or worsening of abdominal pains  Swelling of the abdomen that is new, acute  Fever of 100F or higher     For urgent or emergent issues, a gastroenterologist can be reached at any hour by calling [phone].   DIET:  We do recommend a small meal at first, but then you may proceed to your regular diet.  Drink plenty of fluids but you should avoid alcoholic beverages for 24 hours.  ACTIVITY:  You should plan to take it easy for the rest of today and you should NOT DRIVE or use heavy machinery until tomorrow (because of the  sedation medicines used during the test).    FOLLOW UP: Our staff will call the number listed on your records the next business day following your procedure to check on you and address any questions or concerns that you may have regarding the information given to you following your procedure. If we do not reach you, we will leave a message.  However, if you are feeling well and you are not experiencing any problems, there is no need to return our call.  We will assume that you have returned to your regular daily activities without incident.  If any biopsies were taken you will be contacted by phone or by letter within the next 1-3 weeks.  Please call us at [phone] if you have not heard about the biopsies in 3 weeks.    SIGNATURES/CONFIDENTIALITY: You and/or your care partner have signed paperwork which will be entered into your electronic medical record.  These signatures attest to the fact that that the information above on your After Visit Summary has been reviewed and is understood.  Full responsibility of the confidentiality of this discharge information lies with you and/or your care-partner. 

## 2017-12-01 NOTE — Progress Notes (Signed)
Pt's states no medical or surgical changes since previsit or office visit. 

## 2017-12-01 NOTE — Progress Notes (Signed)
Called to room to assist during endoscopic procedure.  Patient ID and intended procedure confirmed with present staff. Received instructions for my participation in the procedure from the performing physician.  

## 2017-12-01 NOTE — Op Note (Signed)
[facility] Patient Name: Kathy Lamb Procedure Date: 12/01/2017 1:20 PM MRN: [id number] Endoscopist: Milus Banister , MD Age: 52 Referring MD:  Date of Birth: 11/16/1965 Gender: Female Account #: [id number] Procedure:                Colonoscopy Indications:              High risk colon cancer surveillance: Personal                            history of colonic polyps; Colonoscopy 2015 1.5cm                            TVA removed from rectum Medicines:                Monitored Anesthesia Care Procedure:                Pre-Anesthesia Assessment:                           - Prior to the procedure, a History and Physical                            was performed, and patient medications and                            allergies were reviewed. The patient's tolerance of                            previous anesthesia was also reviewed. The risks                            and benefits of the procedure and the sedation                            options and risks were discussed with the patient.                            All questions were answered, and informed consent                            was obtained. Prior Anticoagulants: The patient has                            taken no previous anticoagulant or antiplatelet                            agents. ASA Grade Assessment: II - A patient with                            mild systemic disease. After reviewing the risks                            and benefits, the patient was deemed in  satisfactory condition to undergo the procedure.                           After obtaining informed consent, the colonoscope                            was passed under direct vision. Throughout the                            procedure, the patient's blood pressure, pulse, and                            oxygen saturations were monitored continuously. The                            Model CF-HQ190L [id number]) scope was  introduced                            through the anus and advanced to the the cecum,                            identified by appendiceal orifice and ileocecal                            valve. The colonoscopy was performed without                            difficulty. The patient tolerated the procedure                            well. The quality of the bowel preparation was                            good. The ileocecal valve, appendiceal orifice, and                            rectum were photographed. Scope In: 1:26:47 PM Scope Out: 1:38:54 PM Scope Withdrawal Time: 0 hours 8 minutes 8 seconds  Total Procedure Duration: 0 hours 12 minutes 7 seconds  Findings:                 A 2 mm polyp was found in the rectum. The polyp was                            sessile. The polyp was removed with a cold biopsy                            forceps. Resection and retrieval were complete.                           The exam was otherwise without abnormality on                            direct and retroflexion views. Complications:  No immediate complications. Estimated blood loss:                            None. Estimated Blood Loss:     Estimated blood loss: none. Impression:               - One 2 mm polyp in the rectum, removed with a cold                            biopsy forceps. Resected and retrieved.                           - The examination was otherwise normal on direct                            and retroflexion views. Recommendation:           - Patient has a contact number available for                            emergencies. The signs and symptoms of potential                            delayed complications were discussed with the                            patient. Return to normal activities tomorrow.                            Written discharge instructions were provided to the                            patient.                           - Resume previous diet.                            - Continue present medications.                           You will receive a letter within 2-3 weeks with the                            pathology results and my final recommendations.                           If the polyp(s) is proven to be 'pre-cancerous' on                            pathology, you will need repeat colonoscopy in 5                            years. Milus Banister, MD 12/01/2017 1:41:28 PM This report has been signed electronically.

## 2017-12-04 ENCOUNTER — Telehealth: Payer: Self-pay | Admitting: *Deleted

## 2017-12-04 NOTE — Telephone Encounter (Signed)
  Follow up Call-  Call back number 12/01/2017  Post procedure Call Back phone  # [phone]  Permission to leave phone message Yes  Some recent data might be hidden     Patient questions:  Do you have a fever, pain , or abdominal swelling? No. Pain Score  0 *  Have you tolerated food without any problems? Yes.    Have you been able to return to your normal activities? Yes.    Do you have any questions about your discharge instructions: Diet   No. Medications  No. Follow up visit  No.  Do you have questions or concerns about your Care? No.  Actions: * If pain score is 4 or above: No action needed, pain <4.

## 2017-12-09 ENCOUNTER — Encounter: Payer: Self-pay | Admitting: Gastroenterology

## 2018-01-24 ENCOUNTER — Encounter: Payer: Self-pay | Admitting: Family Medicine

## 2018-01-29 NOTE — Telephone Encounter (Signed)
Have her come in for an OV

## 2018-01-31 ENCOUNTER — Ambulatory Visit: Payer: BC Managed Care – PPO | Admitting: Family Medicine

## 2018-01-31 ENCOUNTER — Encounter: Payer: Self-pay | Admitting: Family Medicine

## 2018-01-31 VITALS — BP 118/78 | HR 99 | Temp 97.7°F | Ht 64.0 in | Wt 172.2 lb

## 2018-01-31 DIAGNOSIS — Z23 Encounter for immunization: Secondary | ICD-10-CM | POA: Diagnosis not present

## 2018-01-31 DIAGNOSIS — R600 Localized edema: Secondary | ICD-10-CM | POA: Diagnosis not present

## 2018-01-31 DIAGNOSIS — I10 Essential (primary) hypertension: Secondary | ICD-10-CM | POA: Diagnosis not present

## 2018-01-31 MED ORDER — METOPROLOL SUCCINATE ER 50 MG PO TB24: 50.0000 mg | ORAL_TABLET | Freq: Every day | ORAL | 3 refills | Status: DC

## 2018-01-31 NOTE — Progress Notes (Signed)
   Subjective:    Patient ID: Kathy Lamb, female    DOB: 1965-09-23, 52 y.o.   MRN: [id number]  HPI Here for swelling in the ankles. 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 It does go down somewhat overnight. No SOB. Her feet get mildly painful at times. She had labs with normal renal function last November.    Review of Systems  Constitutional: Negative.   Respiratory: Negative.   Cardiovascular: Positive for leg swelling. Negative for chest pain and palpitations.  Neurological: Negative.        Objective:   Physical Exam  Constitutional: She is oriented to person, place, and time. She appears well-developed and well-nourished.  Neck: No thyromegaly present.  Cardiovascular: Normal rate, regular rhythm, normal heart sounds and intact distal pulses.  Pulmonary/Chest: Effort normal and breath sounds normal. No stridor. No respiratory distress. She has no wheezes. She has no rales.  Musculoskeletal:  1+ edema to both ankles   Lymphadenopathy:    She has no cervical adenopathy.  Neurological: She is alert and oriented to person, place, and time.          Assessment & Plan:  Pedal edema, likely a side effect of her Amlodipine. We will stop this and start on Metoprolol succinate 50 mg daily. Recheck in 4 weeks.  Alysia Penna, MD

## 2018-03-02 ENCOUNTER — Ambulatory Visit: Payer: BC Managed Care – PPO | Admitting: Family Medicine

## 2018-03-02 ENCOUNTER — Encounter: Payer: Self-pay | Admitting: Family Medicine

## 2018-03-02 VITALS — BP 138/90 | HR 64 | Temp 98.3°F | Wt 172.3 lb

## 2018-03-02 DIAGNOSIS — I10 Essential (primary) hypertension: Secondary | ICD-10-CM | POA: Diagnosis not present

## 2018-03-02 DIAGNOSIS — R600 Localized edema: Secondary | ICD-10-CM

## 2018-03-02 MED ORDER — METOPROLOL SUCCINATE ER 50 MG PO TB24: 50.0000 mg | ORAL_TABLET | Freq: Every day | ORAL | 3 refills | Status: DC

## 2018-03-02 NOTE — Progress Notes (Signed)
   Subjective:    Patient ID: Kathy Lamb, female    DOB: Jun 20, 1965, 52 y.o.   MRN: [id number]  HPI Here to follow up. One month ago she presented with swollen ankles and we felt this was an effect of her Amlodipine. This was stopped and she was started on Metoprolol. This has been very successful. The ankle swelling has resolved and her BP is stable. She feels well.    Review of Systems  Constitutional: Negative.   Respiratory: Negative.   Cardiovascular: Negative.   Neurological: Negative.        Objective:   Physical Exam  Constitutional: She is oriented to person, place, and time. She appears well-developed and well-nourished.  Cardiovascular: Normal rate, regular rhythm, normal heart sounds and intact distal pulses.  Pulmonary/Chest: Effort normal and breath sounds normal. No stridor. No respiratory distress. She has no wheezes. She has no rales.  Musculoskeletal: She exhibits no edema.  Neurological: She is alert and oriented to person, place, and time.          Assessment & Plan:  The edema has resolved and her HTN is stable.  Alysia Penna, MD

## 2018-03-03 ENCOUNTER — Other Ambulatory Visit: Payer: Self-pay | Admitting: Family Medicine

## 2018-04-13 ENCOUNTER — Other Ambulatory Visit: Payer: Self-pay | Admitting: Family Medicine

## 2018-04-24 NOTE — Telephone Encounter (Signed)
Rx not on current med list.  Last filled on November of 2018  Ok to fill?

## 2018-04-27 ENCOUNTER — Telehealth: Payer: Self-pay | Admitting: Family Medicine

## 2018-04-27 MED ORDER — ALMOTRIPTAN MALATE 12.5 MG PO TABS: ORAL_TABLET | ORAL | 11 refills | Status: DC

## 2018-04-27 NOTE — Telephone Encounter (Signed)
Done

## 2018-04-28 ENCOUNTER — Other Ambulatory Visit: Payer: Self-pay | Admitting: Family Medicine

## 2018-05-03 ENCOUNTER — Encounter: Payer: Self-pay | Admitting: *Deleted

## 2018-05-03 ENCOUNTER — Emergency Department (INDEPENDENT_AMBULATORY_CARE_PROVIDER_SITE_OTHER): Payer: BC Managed Care – PPO

## 2018-05-03 ENCOUNTER — Other Ambulatory Visit: Payer: Self-pay

## 2018-05-03 ENCOUNTER — Emergency Department (INDEPENDENT_AMBULATORY_CARE_PROVIDER_SITE_OTHER)
Admission: EM | Admit: 2018-05-03 | Discharge: 2018-05-03 | Disposition: A | Discharge status: Home / Self Care | Payer: BC Managed Care – PPO | Source: Home / Self Care | Attending: Emergency Medicine | Admitting: Emergency Medicine

## 2018-05-03 DIAGNOSIS — S8012XA Contusion of left lower leg, initial encounter: Secondary | ICD-10-CM | POA: Diagnosis not present

## 2018-05-03 DIAGNOSIS — M79662 Pain in left lower leg: Secondary | ICD-10-CM

## 2018-05-03 MED ORDER — ONDANSETRON 4 MG PO TBDP
4.0000 mg | ORAL_TABLET | Freq: Once | ORAL | Status: AC
  Administered 2018-05-03: 4 mg via ORAL

## 2018-05-03 MED ORDER — TRAMADOL HCL 50 MG PO TABS: 50.0000 mg | ORAL_TABLET | Freq: Four times a day (QID) | ORAL | 0 refills | Status: DC | PRN

## 2018-05-03 MED ORDER — IBUPROFEN 400 MG PO TABS
400.0000 mg | ORAL_TABLET | Freq: Once | ORAL | Status: AC
  Administered 2018-05-03: 400 mg via ORAL

## 2018-05-03 MED ORDER — ONDANSETRON 4 MG PO TBDP: 4.0000 mg | ORAL_TABLET | Freq: Three times a day (TID) | ORAL | 0 refills | Status: DC | PRN

## 2018-05-03 NOTE — ED Triage Notes (Signed)
Pt c/o LT lower leg pain post fall in her driveway on rocks yesterday, the pain became worse today after she hit her leg on a chair at work. She has applied ice.

## 2018-05-03 NOTE — Discharge Instructions (Addendum)
X-ray left leg shows no fracture or dislocation. You have a severe of your left leg.  It will get better with time, but will likely take 2 to 3 weeks. Please see attached instruction sheet on contusions. Treatment is ice and elevation for 24 to 48 hours. Ibuprofen for mild to moderate pain. I sent a prescription to your pharmacy for tramadol, which is a very strong pain medicine.  I also sent Rx for Zofran if needed for nausea. Follow-up with your PCP or orthopedist if not improving in 1 week, but seek medical care sooner if worse or severe symptoms in the meantime.

## 2018-05-03 NOTE — ED Provider Notes (Signed)
[facility]    CSN: [id number] Arrival date & time: 05/03/18  1215     History   Chief Complaint Chief Complaint  Patient presents with  . Fall  . Leg Pain    HPI Kathy Lamb is a 52 y.o. female.    Fall   Leg Pain  Complains of severe left anterior lower leg pain for 1 day.  Yesterday, she accidentally fell on her driveway and left anterior leg struck some large rocks.  She was wearing pants, no open wound occurred.  She has been able to ambulate, but pain is sharp and dull and throbbing and 8 or 9 out of 10 at rest and when she moves.  No radiation into ankle or foot.  Denies weakness or numbness.  No loss of consciousness.  No back or spinal pain.  No focal neurologic symptoms or headache.  Had minimal nausea, she states from the severe pain but no vomiting or diarrhea or abdominal pain or GI symptoms.  Never had fracture or major injury of left lower extremity.  Past Medical History:  Diagnosis Date  . Allergy   . Anxiety   . Blindness of right eye    congenital  . Depression   . Family history of anesthesia complication    n/v  . Gallstones   . Headache(784.0)   . History of gestational diabetes   . Hypertension   . Migraine headache   . Nystagmus     Patient Active Problem List   Diagnosis Date Noted  . 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 HTN (hypertension) 10/14/2015  . Nystagmus   . GESTATIONAL DIABETES 05/27/2010  . BACK PAIN, THORACIC REGION 02/27/2009  . DEPRESSION 06/08/2007  . Migraine headache 06/08/2007  . NECK PAIN 06/08/2007  . HEADACHE 06/08/2007    Past Surgical History:  Procedure Laterality Date  . CHOLECYSTECTOMY N/A 02/07/2013   Procedure: LAPAROSCOPIC CHOLECYSTECTOMY WITH INTRAOPERATIVE CHOLANGIOGRAM;  Surgeon: Merrie Roof, MD;  Location: [facility];  Service: General;  Laterality: N/A;  . COLONOSCOPY  02/04/2014   per Dr. Ardis Hughs, adenomatous polyp, repeat in 3 yrs   . CYST REMOVAL LEG Left 1995  . ELBOW SURGERY Left  1974    OB History    Gravida  1   Para  1   Term      Preterm      AB      Living  1     SAB      TAB      Ectopic      Multiple      Live Births               Home Medications    Prior to Admission medications   Medication Sig Start Date End Date Taking? Authorizing Provider  almotriptan (AXERT) 12.5 MG tablet may repeat in 2 hours if needed 04/27/18   Laurey Morale, MD  amLODipine (NORVASC) 10 MG tablet TAKE 1 TABLET(10 MG) BY MOUTH DAILY 04/25/18   Laurey Morale, MD  buPROPion (WELLBUTRIN XL) 300 MG 24 hr tablet TAKE 1 TABLET BY MOUTH EVERY MORNING 03/05/18   Laurey Morale, MD  fexofenadine (ALLEGRA) 180 MG tablet Take 1 tablet (180 mg total) by mouth daily as needed (allergies). 12/31/15   Laurey Morale, MD  metoprolol succinate (TOPROL-XL) 50 MG 24 hr tablet Take 1 tablet (50 mg total) by mouth daily. Take with or immediately following a meal. 03/02/18   Alysia Penna  A, MD  ondansetron (ZOFRAN-ODT) 4 MG disintegrating tablet Take 1 tablet (4 mg total) by mouth every 8 (eight) hours as needed for nausea or vomiting. 05/03/18   Jacqulyn Cane, MD  topiramate (TOPAMAX) 50 MG tablet Take 1 tablet (50 mg total) by mouth daily. 04/17/17   Laurey Morale, MD  traMADol (ULTRAM) 50 MG tablet Take 1 tablet (50 mg total) by mouth every 6 (six) hours as needed. 05/03/18   Jacqulyn Cane, MD  venlafaxine XR (EFFEXOR-XR) 75 MG 24 hr capsule TAKE ONE CAPSULE BY MOUTH ONCE DAILY 05/01/18   Laurey Morale, MD    Family History Family History  Problem Relation Age of Onset  . Diabetes Maternal Grandmother   . Colon cancer Maternal Grandmother   . Lung cancer Father   . Pancreatic cancer Maternal Grandfather        grandfather  . Arthritis Mother   . Osteoporosis Mother   . Rheum arthritis Mother   . Heart disease Mother   . Hypertension Other   . Esophageal cancer Neg Hx   . Rectal cancer Neg Hx   . Stomach cancer Neg Hx   . Liver cancer Neg Hx     Social  History Social History   Tobacco Use  . Smoking status: Never Smoker  . Smokeless tobacco: Never Used  Substance Use Topics  . Alcohol use: Yes    Alcohol/week: 0.0 standard drinks    Comment: occasional wine  . Drug use: No     Allergies   Lisinopril; Losartan; Norco [hydrocodone-acetaminophen]; and Oxycodone   Review of Systems Review of Systems  All other systems reviewed and are negative.    Physical Exam Triage Vital Signs ED Triage Vitals  Enc Vitals Group     BP 05/03/18 1251 (!) 159/85     Pulse Rate 05/03/18 1251 77     Resp 05/03/18 1251 18     Temp 05/03/18 1251 98.1 F (36.7 C)     Temp Source 05/03/18 1251 Oral     SpO2 05/03/18 1251 98 %     Weight 05/03/18 1252 171 lb (77.6 kg)     Height 05/03/18 1252 5\' 4"  (1.626 m)     Head Circumference --      Peak Flow --      Pain Score 05/03/18 1252 8     Pain Loc --      Pain Edu? --      Excl. in [place]? --    No data found.  Updated Vital Signs BP (!) 159/85 (BP Location: Right Arm)   Pulse 77   Temp 98.1 F (36.7 C) (Oral)   Resp 18   Ht 5\' 4"  (1.626 m)   Wt 77.6 kg   LMP 02/28/2014   SpO2 98%   BMI 29.35 kg/m   Physical Exam Vitals signs reviewed.  Constitutional:      General: She is not in acute distress.    Appearance: She is well-developed.  HENT:     Head: Normocephalic and atraumatic.  Eyes:     General: No scleral icterus.    Pupils: Pupils are equal, round, and reactive to light.  Neck:     Musculoskeletal: Normal range of motion and neck supple.  Cardiovascular:     Rate and Rhythm: Normal rate and regular rhythm.  Pulmonary:     Effort: Pulmonary effort is normal.  Abdominal:     General: There is no distension.  Skin:    General:  Skin is warm and dry.  Neurological:     Mental Status: She is alert and oriented to person, place, and time.     Cranial Nerves: No cranial nerve deficit.  Psychiatric:        Behavior: Behavior normal.  Left lower leg: Along anterior  tibial area, severe swelling, ecchymosis, induration and tenderness, starting from about 6 cm below the patella to 6 cm above the ankle. Left knee functions normally without tenderness or sign of injury.  No instability. Left ankle nontender without swelling.  Normal range of motion of ankle. Neurovascular distally intact left lower extremity.  Patient became nauseated right after exam. Given Zofran for nausea.-15 minutes later, patient reevaluated and nausea resolved.  She stated that she feels the nausea was from pain and not from the ibuprofen which she is taken in the past without problems. t. UC Treatments / Results  Labs (all labs ordered are listed, but only abnormal results are displayed) Labs Reviewed - No data to display  EKG None  Radiology Dg Tibia/fibula Left  Result Date: 05/03/2018 CLINICAL DATA:  Left lower leg pain after fall 2 days ago. EXAM: LEFT TIBIA AND FIBULA - 2 VIEW COMPARISON:  None. FINDINGS: There is no evidence of fracture or other focal bone lesions. Soft tissues are unremarkable. IMPRESSION: Negative. Electronically Signed   By: Marijo Conception, M.D.   On: 05/03/2018 13:39    Procedures Procedures (including critical care time)  Medications Ordered in UC Medications  ibuprofen (ADVIL,MOTRIN) tablet 400 mg (400 mg Oral Given 05/03/18 1250)  ondansetron (ZOFRAN-ODT) disintegrating tablet 4 mg (4 mg Oral Given 05/03/18 1309)    Initial Impression / Assessment and Plan / UC Course  I have reviewed the triage vital signs and the nursing notes.  Pertinent labs & imaging results that were available during my care of the patient were reviewed by me and considered in my medical decision making (see chart for details).  Clinical Course as of May 03 1437  Thu May 03, 2018  1409 Reviewed with patient, no fracture  DG Tibia/Fibula Left [DM]    Clinical Course User Index [DM] Jacqulyn Cane, MD    An After Visit Summary was printed and given to the  patient. Reviewed discharge directions at length with patient, who voiced understanding and agreement with plans.  Note written to excuse from work from now through Monday, 05/07/2018  Final Clinical Impressions(s) / UC Diagnoses   Final diagnoses:  Contusion of left leg, initial encounter     Discharge Instructions     X-ray left leg shows no fracture or dislocation. You have a severe of your left leg.  It will get better with time, but will likely take 2 to 3 weeks. Please see attached instruction sheet on contusions. Treatment is ice and elevation for 24 to 48 hours. Ibuprofen for mild to moderate pain. I sent a prescription to your pharmacy for tramadol, which is a very strong pain medicine.  I also sent Rx for Zofran if needed for nausea. Follow-up with your PCP or orthopedist if not improving in 1 week, but seek medical care sooner if worse or severe symptoms in the meantime.    ED Prescriptions    Medication Sig Dispense Auth. Provider   ondansetron (ZOFRAN-ODT) 4 MG disintegrating tablet Take 1 tablet (4 mg total) by mouth every 8 (eight) hours as needed for nausea or vomiting. 5 tablet Jacqulyn Cane, MD   traMADol (ULTRAM) 50 MG tablet Take 1 tablet (  50 mg total) by mouth every 6 (six) hours as needed. 15 tablet Jacqulyn Cane, MD     Controlled Substance Prescriptions [place] Controlled Substance Registry consulted? Yes, I have consulted the [place] Controlled Substances Registry for this patient, and feel the risk/benefit ratio today is favorable for proceeding with this prescription for a controlled substance.   Jacqulyn Cane, MD 05/03/18 [phone]

## 2018-05-28 ENCOUNTER — Other Ambulatory Visit: Payer: Self-pay | Admitting: Family Medicine

## 2018-07-27 ENCOUNTER — Other Ambulatory Visit: Payer: Self-pay | Admitting: Family Medicine

## 2018-08-26 ENCOUNTER — Other Ambulatory Visit: Payer: Self-pay | Admitting: Family Medicine

## 2018-10-25 ENCOUNTER — Other Ambulatory Visit: Payer: Self-pay | Admitting: Family Medicine

## 2018-11-24 ENCOUNTER — Other Ambulatory Visit: Payer: Self-pay | Admitting: Family Medicine

## 2018-11-27 NOTE — Telephone Encounter (Signed)
Dr. Fry please advise on refill. Thanks  

## 2019-01-23 ENCOUNTER — Other Ambulatory Visit: Payer: Self-pay | Admitting: Family Medicine

## 2019-02-22 ENCOUNTER — Other Ambulatory Visit: Payer: Self-pay | Admitting: Family Medicine

## 2019-03-08 ENCOUNTER — Other Ambulatory Visit: Payer: Self-pay

## 2019-03-08 ENCOUNTER — Encounter: Payer: Self-pay | Admitting: Family Medicine

## 2019-03-08 ENCOUNTER — Ambulatory Visit: Payer: BC Managed Care – PPO | Admitting: Family Medicine

## 2019-03-08 VITALS — BP 120/88 | HR 95 | Temp 97.8°F | Ht 64.0 in | Wt 178.4 lb

## 2019-03-08 DIAGNOSIS — Z23 Encounter for immunization: Secondary | ICD-10-CM

## 2019-03-08 DIAGNOSIS — I10 Essential (primary) hypertension: Secondary | ICD-10-CM | POA: Diagnosis not present

## 2019-03-08 NOTE — Progress Notes (Signed)
tdap

## 2019-03-08 NOTE — Progress Notes (Signed)
   Subjective:    Patient ID: Gevena Lamb, female    DOB: 16-Apr-1966, 53 y.o.   MRN: [id number]  HPI Here for elevated BP. She has been taking Metoprolol tartrate 50 mg bid for some time, but over the past few weeks her BP has been elevated. The systolic readings have been as high as [id number] and diastolic as high as 123456. She has had some headaches and dizziness, but no chest pain or SOB. No ankle swelling. No recent weight change.   Review of Systems  Constitutional: Negative.   Respiratory: Negative.   Cardiovascular: Negative.   Neurological: Positive for light-headedness and headaches.       Objective:   Physical Exam Constitutional:      Appearance: Normal appearance.  Cardiovascular:     Rate and Rhythm: Normal rate and regular rhythm.     Pulses: Normal pulses.     Heart sounds: Normal heart sounds.  Pulmonary:     Effort: Pulmonary effort is normal.     Breath sounds: Normal breath sounds.  Musculoskeletal:     Right lower leg: No edema.     Left lower leg: No edema.  Neurological:     General: No focal deficit present.     Mental Status: She is alert and oriented to person, place, and time.           Assessment & Plan:  HTN, increase the Metoprolol to 100 mg bid. Recheck in 2 weeks with a well exam and labs.  Alysia Penna, MD

## 2019-03-22 ENCOUNTER — Ambulatory Visit (INDEPENDENT_AMBULATORY_CARE_PROVIDER_SITE_OTHER): Payer: BC Managed Care – PPO | Admitting: Family Medicine

## 2019-03-22 ENCOUNTER — Other Ambulatory Visit: Payer: Self-pay

## 2019-03-22 ENCOUNTER — Encounter: Payer: Self-pay | Admitting: Family Medicine

## 2019-03-22 VITALS — BP 128/80 | HR 82 | Temp 97.3°F | Ht 63.75 in | Wt 179.0 lb

## 2019-03-22 DIAGNOSIS — Z0000 Encounter for general adult medical examination without abnormal findings: Secondary | ICD-10-CM | POA: Diagnosis not present

## 2019-03-22 NOTE — Progress Notes (Signed)
Subjective:    Patient ID: Kathy Lamb, female    DOB: 1966-01-27, 53 y.o.   MRN: [id number]  HPI Here for a well exam. She feels fine.    Review of Systems  Constitutional: Negative.  Negative for activity change, appetite change, diaphoresis, fatigue, fever and unexpected weight change.  HENT: Negative.  Negative for congestion, ear pain, hearing loss, nosebleeds, sore throat, tinnitus, trouble swallowing and voice change.   Eyes: Negative.  Negative for photophobia, pain, discharge, redness and visual disturbance.  Respiratory: Negative.  Negative for apnea, cough, choking, chest tightness, shortness of breath, wheezing and stridor.   Cardiovascular: Negative.  Negative for chest pain, palpitations and leg swelling.  Gastrointestinal: Negative.  Negative for abdominal distention, abdominal pain, blood in stool, constipation, diarrhea, nausea, rectal pain and vomiting.  Genitourinary: Negative.  Negative for difficulty urinating, dysuria, enuresis, flank pain, frequency, hematuria, menstrual problem, urgency, vaginal bleeding, vaginal discharge and vaginal pain.  Musculoskeletal: Negative.  Negative for arthralgias, back pain, gait problem, joint swelling, myalgias, neck pain and neck stiffness.  Skin: Negative.  Negative for color change, pallor, rash and wound.  Neurological: Negative.  Negative for dizziness, tremors, seizures, syncope, speech difficulty, weakness, light-headedness, numbness and headaches.  Hematological: Negative for adenopathy. Does not bruise/bleed easily.  Psychiatric/Behavioral: Negative.  Negative for agitation, behavioral problems, confusion, dysphoric mood, hallucinations and sleep disturbance. The patient is not nervous/anxious.        Objective:   Physical Exam Constitutional:      General: She is not in acute distress.    Appearance: Normal appearance. She is well-developed. She is not diaphoretic.  HENT:     Head: Normocephalic and atraumatic.   Right Ear: External ear normal.     Left Ear: External ear normal.     Nose: Nose normal.     Mouth/Throat:     Pharynx: No oropharyngeal exudate.  Eyes:     General: No scleral icterus.       Right eye: No discharge.        Left eye: No discharge.     Conjunctiva/sclera: Conjunctivae normal.     Pupils: Pupils are equal, round, and reactive to light.  Neck:     Musculoskeletal: Normal range of motion and neck supple.     Thyroid: No thyromegaly.     Vascular: No JVD.  Cardiovascular:     Rate and Rhythm: Normal rate and regular rhythm.     Heart sounds: Normal heart sounds. No murmur. No friction rub. No gallop.   Pulmonary:     Effort: Pulmonary effort is normal. No respiratory distress.     Breath sounds: Normal breath sounds. No stridor. No wheezing or rales.  Chest:     Chest wall: No tenderness.  Abdominal:     General: Bowel sounds are normal. There is no distension or abdominal bruit.     Palpations: Abdomen is soft. Abdomen is not rigid. There is no mass.     Tenderness: There is no abdominal tenderness. There is no guarding or rebound.     Hernia: No hernia is present.  Genitourinary:    Vagina: Normal. No vaginal discharge, erythema, tenderness or bleeding.     Cervix: No cervical motion tenderness, discharge or friability.     Adnexa:        Right: No mass, tenderness or fullness.         Left: No mass, tenderness or fullness.       Rectum: Normal.  Musculoskeletal: Normal range of motion.        General: No tenderness.  Lymphadenopathy:     Cervical: No cervical adenopathy.  Skin:    General: Skin is warm and dry.     Coloration: Skin is not pale.     Findings: No erythema or rash.  Neurological:     Mental Status: She is alert.     Cranial Nerves: No cranial nerve deficit.     Motor: No abnormal muscle tone.     Coordination: Coordination normal.     Deep Tendon Reflexes: Reflexes are normal and symmetric.  Psychiatric:        Behavior: Behavior normal.         Thought Content: Thought content normal.        Judgment: Judgment normal.           Assessment & Plan:  Well exam. We discussed diet and exercise. Get fasting labs soon. She will set up a mammogram.  Alysia Penna, MD

## 2019-03-22 NOTE — Patient Instructions (Signed)
Health Maintenance Due  Topic Date Due  . Hepatitis C Screening  March 24, 1966  . HIV Screening  10/08/1978  . PAP SMEAR-Modifier  03/11/2017  . TETANUS/TDAP  01/17/2018    No flowsheet data found.

## 2019-03-27 ENCOUNTER — Other Ambulatory Visit: Payer: Self-pay

## 2019-03-27 ENCOUNTER — Other Ambulatory Visit (INDEPENDENT_AMBULATORY_CARE_PROVIDER_SITE_OTHER): Payer: BC Managed Care – PPO

## 2019-03-27 DIAGNOSIS — Z0000 Encounter for general adult medical examination without abnormal findings: Secondary | ICD-10-CM | POA: Diagnosis not present

## 2019-03-27 LAB — BASIC METABOLIC PANEL
BUN: 14 mg/dL (ref 6–23)
CO2: 30 mEq/L (ref 19–32)
Calcium: 9.2 mg/dL (ref 8.4–10.5)
Chloride: 103 mEq/L (ref 96–112)
Creatinine, Ser: 0.76 mg/dL (ref 0.40–1.20)
GFR: 78.88 mL/min (ref >60.00)
Glucose, Bld: 96 mg/dL (ref 70–99)
Potassium: 4.1 mEq/L (ref 3.5–5.1)
Sodium: 139 mEq/L (ref 135–145)

## 2019-03-27 LAB — LIPID PANEL
Cholesterol: 224 mg/dL — ABNORMAL HIGH (ref 0–200)
HDL: 56.3 mg/dL (ref >39.00)
LDL Cholesterol: 143 mg/dL — ABNORMAL HIGH (ref 0–99)
NonHDL: 167.47
Total CHOL/HDL Ratio: 4
Triglycerides: 121 mg/dL (ref 0.0–149.0)
VLDL: 24.2 mg/dL (ref 0.0–40.0)

## 2019-03-27 LAB — CBC WITH DIFFERENTIAL/PLATELET
Basophils Absolute: 0 10*3/uL (ref 0.0–0.1)
Basophils Relative: 0.6 % (ref 0.0–3.0)
Eosinophils Absolute: 0.2 10*3/uL (ref 0.0–0.7)
Eosinophils Relative: 2.7 % (ref 0.0–5.0)
HCT: 39.1 % (ref 36.0–46.0)
Hemoglobin: 13.3 g/dL (ref 12.0–15.0)
Lymphocytes Relative: 29.6 % (ref 12.0–46.0)
Lymphs Abs: 1.7 10*3/uL (ref 0.7–4.0)
MCHC: 34 g/dL (ref 30.0–36.0)
MCV: 88.9 fl (ref 78.0–100.0)
Monocytes Absolute: 0.4 10*3/uL (ref 0.1–1.0)
Monocytes Relative: 6.7 % (ref 3.0–12.0)
Neutro Abs: 3.4 10*3/uL (ref 1.4–7.7)
Neutrophils Relative %: 60.4 % (ref 43.0–77.0)
Platelets: 213 10*3/uL (ref 150.0–400.0)
RBC: 4.4 Mil/uL (ref 3.87–5.11)
RDW: 12.9 % (ref 11.5–15.5)
WBC: 5.7 10*3/uL (ref 4.0–10.5)

## 2019-03-27 LAB — POC URINALSYSI DIPSTICK (AUTOMATED)
Glucose, UA: NEGATIVE
Leukocytes, UA: NEGATIVE
Protein, UA: POSITIVE — AB
Spec Grav, UA: 1.025 (ref 1.010–1.025)
Urobilinogen, UA: 0.2 E.U./dL
pH, UA: 6 (ref 5.0–8.0)

## 2019-03-27 LAB — HEPATIC FUNCTION PANEL
ALT: 17 U/L (ref 0–35)
AST: 15 U/L (ref 0–37)
Albumin: 4.4 g/dL (ref 3.5–5.2)
Alkaline Phosphatase: 95 U/L (ref 39–117)
Bilirubin, Direct: 0.1 mg/dL (ref 0.0–0.3)
Total Bilirubin: 0.5 mg/dL (ref 0.2–1.2)
Total Protein: 6.8 g/dL (ref 6.0–8.3)

## 2019-03-27 LAB — TSH: TSH: 1.87 u[IU]/mL (ref 0.35–4.50)

## 2019-03-28 MED ORDER — ATORVASTATIN CALCIUM 10 MG PO TABS: 10.0000 mg | ORAL_TABLET | Freq: Every day | ORAL | 3 refills | Status: DC

## 2019-04-22 ENCOUNTER — Encounter: Payer: Self-pay | Admitting: Family Medicine

## 2019-04-23 ENCOUNTER — Other Ambulatory Visit: Payer: Self-pay | Admitting: Family Medicine

## 2019-04-23 MED ORDER — METOPROLOL SUCCINATE ER 100 MG PO TB24: 100.0000 mg | ORAL_TABLET | Freq: Every day | ORAL | 3 refills | Status: DC

## 2019-04-23 NOTE — Telephone Encounter (Signed)
Yes please DC the 50 mg dose and call in Metoprolol succinate 100 mg daily, #90 with 3 rf

## 2019-04-23 NOTE — Telephone Encounter (Signed)
Ok to send in 100mg  instead of 50?

## 2019-07-22 ENCOUNTER — Other Ambulatory Visit: Payer: Self-pay | Admitting: Family Medicine

## 2019-10-20 ENCOUNTER — Other Ambulatory Visit: Payer: Self-pay | Admitting: Family Medicine

## 2019-11-19 ENCOUNTER — Other Ambulatory Visit: Payer: Self-pay | Admitting: Family Medicine

## 2019-11-29 ENCOUNTER — Other Ambulatory Visit: Payer: Self-pay | Admitting: Family Medicine

## 2019-11-29 NOTE — Telephone Encounter (Signed)
Please advise on the signiture

## 2019-12-02 MED ORDER — VENLAFAXINE HCL ER 75 MG PO CP24: 75.0000 mg | ORAL_CAPSULE | Freq: Every day | ORAL | 3 refills | Status: DC

## 2019-12-06 ENCOUNTER — Other Ambulatory Visit: Payer: Self-pay

## 2019-12-06 ENCOUNTER — Encounter: Payer: Self-pay | Admitting: Family Medicine

## 2019-12-06 ENCOUNTER — Ambulatory Visit: Payer: BC Managed Care – PPO | Admitting: Family Medicine

## 2019-12-06 VITALS — BP 130/70 | HR 71 | Temp 97.8°F | Wt 177.6 lb

## 2019-12-06 DIAGNOSIS — F320 Major depressive disorder, single episode, mild: Secondary | ICD-10-CM

## 2019-12-06 MED ORDER — VENLAFAXINE HCL ER 37.5 MG PO CP24: 37.5000 mg | ORAL_CAPSULE | Freq: Every day | ORAL | 2 refills | Status: DC

## 2019-12-06 NOTE — Progress Notes (Signed)
   Subjective:    Patient ID: Kathy Lamb, female    DOB: 02-02-1966, 54 y.o.   MRN: [id number]  HPI Here to follow up on depression. This has been well controlled on a combination of Wellbutrin XL 300 mg daily and Effexor XR 75 mg daily. However her libido has been almost zero for awhile and she thinks this is due to the medications. Sleep and appetite are intact.    Review of Systems  Constitutional: Negative.   Respiratory: Negative.   Cardiovascular: Negative.   Psychiatric/Behavioral: Negative for agitation, decreased concentration, dysphoric mood, hallucinations and sleep disturbance. The patient is not nervous/anxious.        Objective:   Physical Exam Constitutional:      Appearance: Normal appearance.  Cardiovascular:     Rate and Rhythm: Normal rate and regular rhythm.     Pulses: Normal pulses.     Heart sounds: Normal heart sounds.  Pulmonary:     Effort: Pulmonary effort is normal.     Breath sounds: Normal breath sounds.  Neurological:     General: No focal deficit present.     Mental Status: She is alert and oriented to person, place, and time.  Psychiatric:        Mood and Affect: Mood normal.        Behavior: Behavior normal.        Thought Content: Thought content normal.        Judgment: Judgment normal.           Assessment & Plan:  Her depression is well controlled, but due to loss of libido we will decrease the Effexor XR to 37.5 mg daily. She will report back in one month. Alysia Penna, MD

## 2020-02-16 ENCOUNTER — Other Ambulatory Visit: Payer: Self-pay | Admitting: Family Medicine

## 2020-02-29 IMAGING — DX DG TIBIA/FIBULA 2V*L*
4 series · 4 of 4 positions shown · non-contrast
Comparison: None.

CLINICAL DATA: Left lower leg pain after fall 2 days ago.

EXAM:
LEFT TIBIA AND FIBULA - 2 VIEW

[tibia ap (1 of 2)]
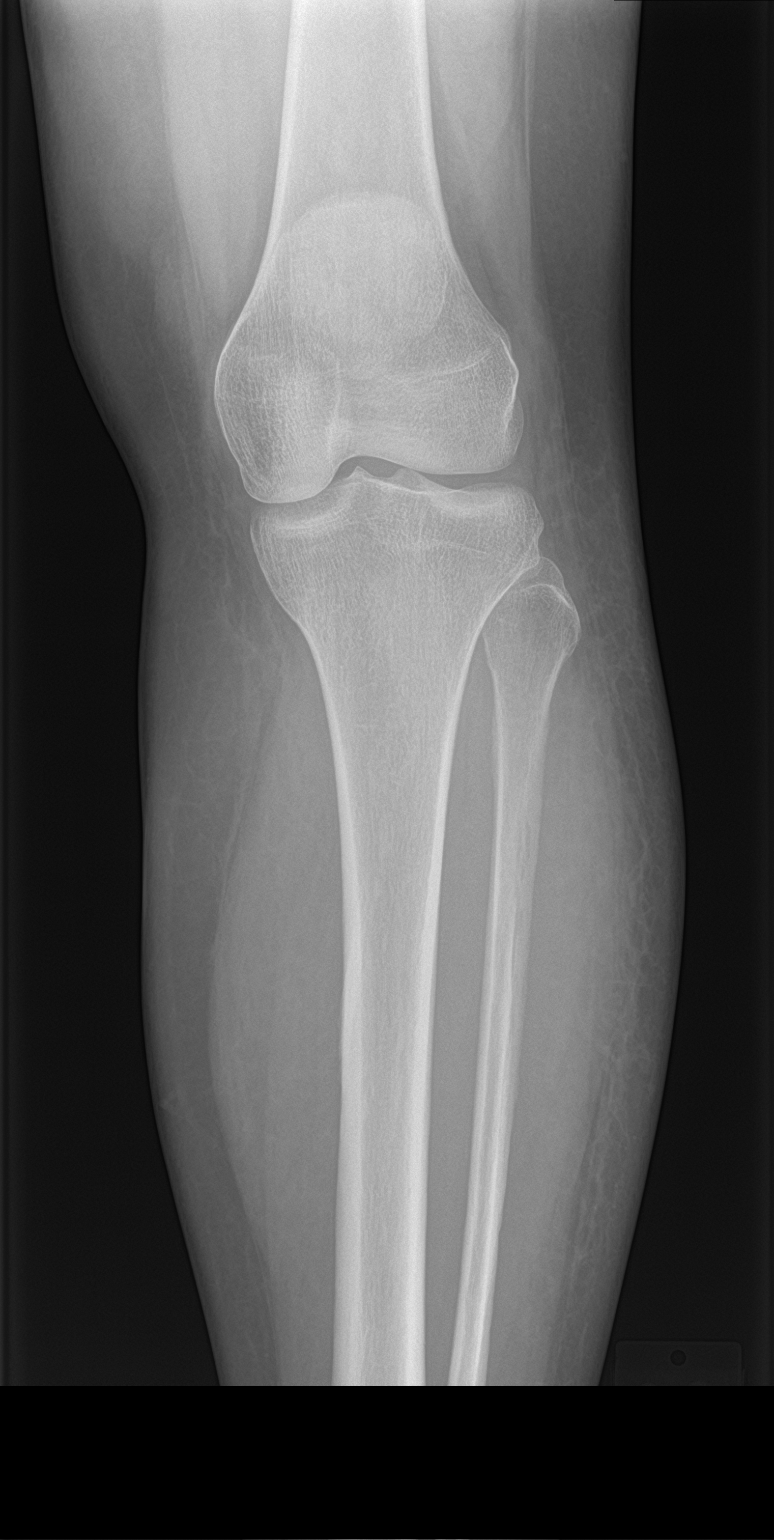

[tibia ap (2 of 2)]
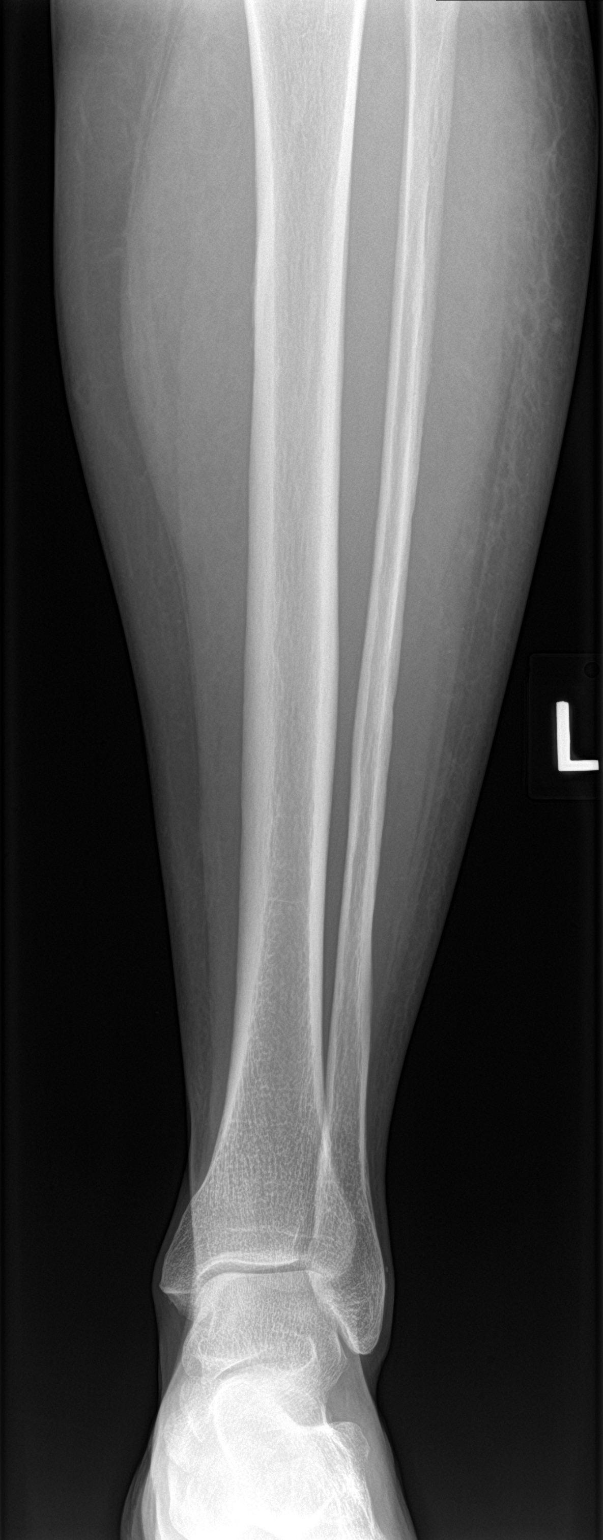

[tibia lat (1 of 2)]
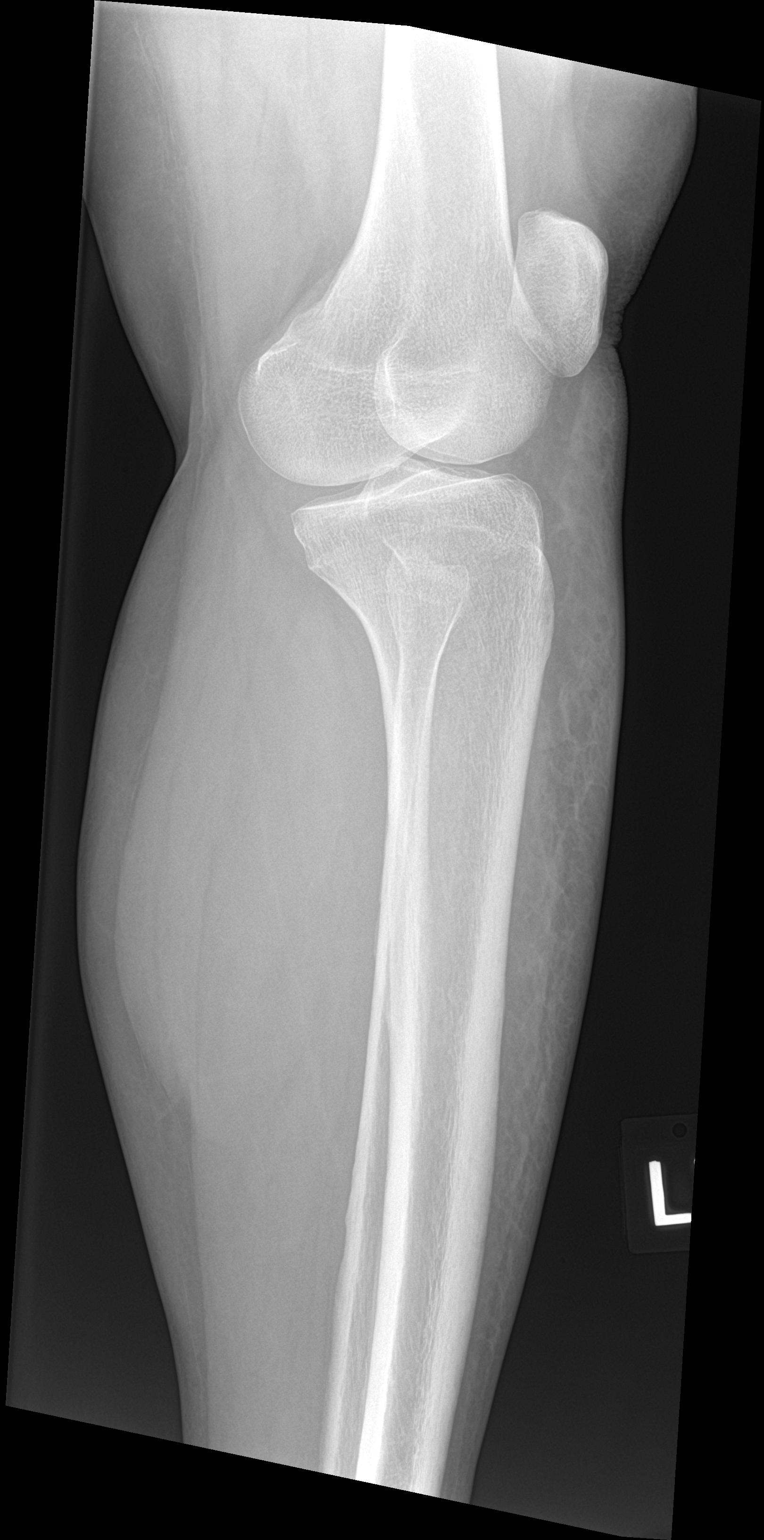

[tibia lat (2 of 2)]
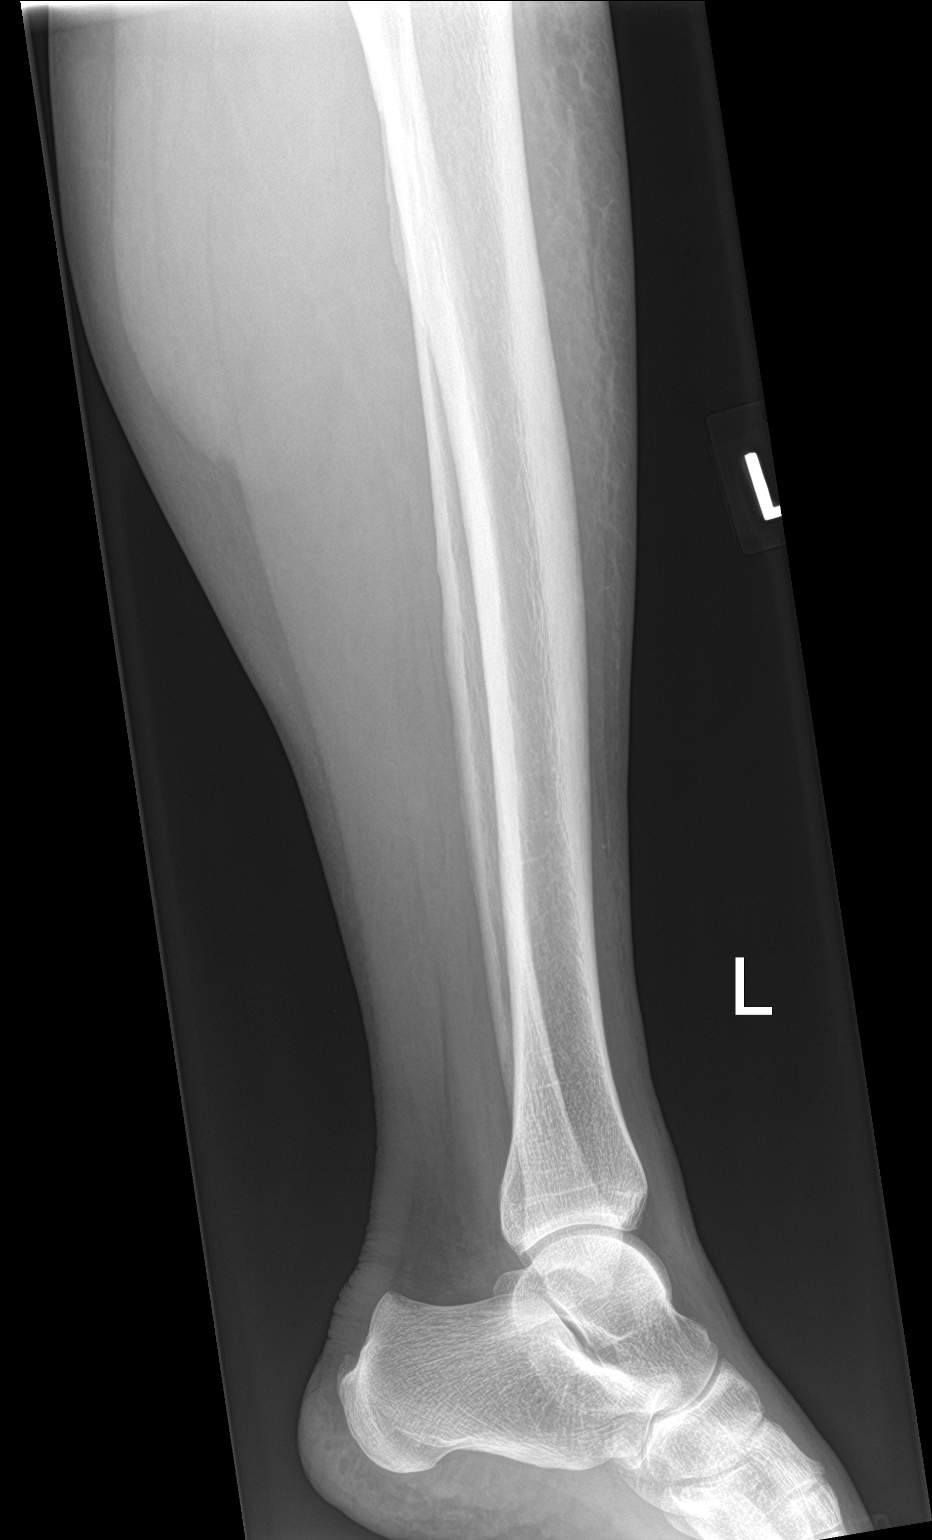

[4 of 4 positions shown; findings below may reference images not displayed]

FINDINGS: There is no evidence of fracture or other focal bone lesions. Soft
tissues are unremarkable.
IMPRESSION: Negative.

## 2020-03-17 ENCOUNTER — Other Ambulatory Visit: Payer: Self-pay | Admitting: Family Medicine

## 2020-04-16 ENCOUNTER — Other Ambulatory Visit: Payer: Self-pay | Admitting: Family Medicine

## 2020-04-25 ENCOUNTER — Other Ambulatory Visit: Payer: Self-pay | Admitting: Family Medicine

## 2020-05-19 ENCOUNTER — Encounter: Payer: Self-pay | Admitting: Family Medicine

## 2020-05-20 ENCOUNTER — Telehealth: Payer: BC Managed Care – PPO | Admitting: Family Medicine

## 2020-05-20 MED ORDER — AZITHROMYCIN 250 MG PO TABS: ORAL_TABLET | ORAL | 0 refills | Status: DC

## 2020-05-20 NOTE — Telephone Encounter (Signed)
Call in a Zpack  ?

## 2020-05-21 ENCOUNTER — Encounter: Payer: Self-pay | Admitting: Family Medicine

## 2020-05-21 NOTE — Telephone Encounter (Signed)
She can go ahead and finish the antibiotic since she started it

## 2020-05-27 ENCOUNTER — Other Ambulatory Visit: Payer: Self-pay | Admitting: Family Medicine

## 2020-05-28 NOTE — Telephone Encounter (Signed)
Last office visit- 11-26-2019 Last refill- 04/27/2020--30 tabs with 0 refills Last lipid labs- 03/27/2019

## 2020-05-29 MED ORDER — ATORVASTATIN CALCIUM 10 MG PO TABS: 10.0000 mg | ORAL_TABLET | Freq: Every day | ORAL | 3 refills | Status: DC

## 2020-10-07 ENCOUNTER — Encounter: Payer: Self-pay | Admitting: Family Medicine

## 2020-10-08 NOTE — Telephone Encounter (Signed)
Set up an in person OV so I can examine her  

## 2020-10-12 ENCOUNTER — Other Ambulatory Visit: Payer: Self-pay

## 2020-10-13 ENCOUNTER — Ambulatory Visit (INDEPENDENT_AMBULATORY_CARE_PROVIDER_SITE_OTHER): Payer: Self-pay | Admitting: Family Medicine

## 2020-10-13 ENCOUNTER — Encounter: Payer: Self-pay | Admitting: Family Medicine

## 2020-10-13 VITALS — BP 146/98 | HR 68 | Temp 97.8°F | Wt 179.6 lb

## 2020-10-13 DIAGNOSIS — R079 Chest pain, unspecified: Secondary | ICD-10-CM

## 2020-10-13 DIAGNOSIS — M940 Chondrocostal junction syndrome [Tietze]: Secondary | ICD-10-CM

## 2020-10-13 DIAGNOSIS — I10 Essential (primary) hypertension: Secondary | ICD-10-CM

## 2020-10-13 MED ORDER — HYDROCHLOROTHIAZIDE 25 MG PO TABS: 25.0000 mg | ORAL_TABLET | Freq: Every day | ORAL | 3 refills | Status: DC

## 2020-10-13 NOTE — Progress Notes (Signed)
   Subjective:    Patient ID: Kathy Lamb, female    DOB: 02-07-1966, 55 y.o.   MRN: [id number]  HPI Here with concerns about 6 weeks of intermittent mild sharp pains in the left side of her neck, sharp pains in the left chest area, and dull pressure like pains in the left chest that radiate to the left upper arm. These last about 10-15 minutes and then go away. They are not related to exertion and they can occur at any time. They do not wake her from sleep. There is no SOB or coughing. No indigestion or heartburn. She does not check her BP at home.    Review of Systems  Constitutional: Negative.   Respiratory: Negative.   Cardiovascular: Positive for chest pain. Negative for palpitations and leg swelling.  Gastrointestinal: Negative.        Objective:   Physical Exam Constitutional:      Appearance: Normal appearance. She is not ill-appearing.  Cardiovascular:     Rate and Rhythm: Normal rate and regular rhythm.     Pulses: Normal pulses.     Heart sounds: Normal heart sounds.     Comments: EKG today is normal  Pulmonary:     Effort: Pulmonary effort is normal.     Breath sounds: Normal breath sounds.     Comments: She is mildly tender in the left anterior chest and over the left sternal border Musculoskeletal:     Cervical back: No rigidity or tenderness.     Right lower leg: No edema.  Lymphadenopathy:     Cervical: No cervical adenopathy.  Neurological:     General: No focal deficit present.     Mental Status: She is alert and oriented to person, place, and time.           Assessment & Plan:  Her chest symptoms are suggestive of costochondritis, and we will treat this by taking 2 Aleve tablets BID for a week or two. Her HTN is not well controlled, so we will add HCTZ 25 mg daily to her regimen. I asked her to walk daily for exercise. She will set up a well exam with fasting labs in about 4 weeks, and we will follow up on these issues at that time. We spent 45  minutes discussing these issues today.  Alysia Penna, MD

## 2020-11-04 ENCOUNTER — Other Ambulatory Visit: Payer: Self-pay

## 2020-11-04 ENCOUNTER — Encounter: Payer: Self-pay | Admitting: Family Medicine

## 2020-11-04 ENCOUNTER — Ambulatory Visit (INDEPENDENT_AMBULATORY_CARE_PROVIDER_SITE_OTHER): Payer: Self-pay | Admitting: Family Medicine

## 2020-11-04 ENCOUNTER — Other Ambulatory Visit (HOSPITAL_COMMUNITY)
Admission: RE | Admit: 2020-11-04 | Discharge: 2020-11-04 | Disposition: A | Discharge status: Home / Self Care | Payer: BC Managed Care – PPO | Source: Ambulatory Visit | Attending: Family Medicine | Admitting: Family Medicine

## 2020-11-04 VITALS — BP 110/80 | HR 67 | Temp 98.3°F | Ht 63.75 in | Wt 175.4 lb

## 2020-11-04 DIAGNOSIS — Z124 Encounter for screening for malignant neoplasm of cervix: Secondary | ICD-10-CM | POA: Insufficient documentation

## 2020-11-04 DIAGNOSIS — Z0000 Encounter for general adult medical examination without abnormal findings: Secondary | ICD-10-CM

## 2020-11-04 LAB — BASIC METABOLIC PANEL
BUN: 15 mg/dL (ref 6–23)
CO2: 32 mEq/L (ref 19–32)
Calcium: 9.8 mg/dL (ref 8.4–10.5)
Chloride: 101 mEq/L (ref 96–112)
Creatinine, Ser: 0.86 mg/dL (ref 0.40–1.20)
GFR: 75.17 mL/min (ref >60.00)
Glucose, Bld: 90 mg/dL (ref 70–99)
Potassium: 3.7 mEq/L (ref 3.5–5.1)
Sodium: 141 mEq/L (ref 135–145)

## 2020-11-04 LAB — LIPID PANEL
Cholesterol: 165 mg/dL (ref 0–200)
HDL: 66.8 mg/dL (ref >39.00)
LDL Cholesterol: 82 mg/dL (ref 0–99)
NonHDL: 98.11
Total CHOL/HDL Ratio: 2
Triglycerides: 79 mg/dL (ref 0.0–149.0)
VLDL: 15.8 mg/dL (ref 0.0–40.0)

## 2020-11-04 LAB — HEPATIC FUNCTION PANEL
ALT: 26 U/L (ref 0–35)
AST: 21 U/L (ref 0–37)
Albumin: 4.6 g/dL (ref 3.5–5.2)
Alkaline Phosphatase: 89 U/L (ref 39–117)
Bilirubin, Direct: 0.2 mg/dL (ref 0.0–0.3)
Total Bilirubin: 0.8 mg/dL (ref 0.2–1.2)
Total Protein: 7 g/dL (ref 6.0–8.3)

## 2020-11-04 LAB — CBC WITH DIFFERENTIAL/PLATELET
Basophils Absolute: 0 10*3/uL (ref 0.0–0.1)
Basophils Relative: 0.6 % (ref 0.0–3.0)
Eosinophils Absolute: 0.2 10*3/uL (ref 0.0–0.7)
Eosinophils Relative: 3.1 % (ref 0.0–5.0)
HCT: 38.5 % (ref 36.0–46.0)
Hemoglobin: 12.9 g/dL (ref 12.0–15.0)
Lymphocytes Relative: 33.2 % (ref 12.0–46.0)
Lymphs Abs: 2 10*3/uL (ref 0.7–4.0)
MCHC: 33.4 g/dL (ref 30.0–36.0)
MCV: 88.1 fl (ref 78.0–100.0)
Monocytes Absolute: 0.3 10*3/uL (ref 0.1–1.0)
Monocytes Relative: 5.9 % (ref 3.0–12.0)
Neutro Abs: 3.4 10*3/uL (ref 1.4–7.7)
Neutrophils Relative %: 57.2 % (ref 43.0–77.0)
Platelets: 194 10*3/uL (ref 150.0–400.0)
RBC: 4.36 Mil/uL (ref 3.87–5.11)
RDW: 13 % (ref 11.5–15.5)
WBC: 5.9 10*3/uL (ref 4.0–10.5)

## 2020-11-04 LAB — T3, FREE: T3, Free: 2.9 pg/mL (ref 2.3–4.2)

## 2020-11-04 LAB — TSH: TSH: 1.43 u[IU]/mL (ref 0.35–4.50)

## 2020-11-04 LAB — T4, FREE: Free T4: 0.72 ng/dL (ref 0.60–1.60)

## 2020-11-04 LAB — HEMOGLOBIN A1C: Hgb A1c MFr Bld: 5.9 % (ref 4.6–6.5)

## 2020-11-04 MED ORDER — BUPROPION HCL ER (XL) 300 MG PO TB24: 1.0000 | ORAL_TABLET | Freq: Every morning | ORAL | 3 refills | Status: DC

## 2020-11-04 MED ORDER — VENLAFAXINE HCL ER 37.5 MG PO CP24: 37.5000 mg | ORAL_CAPSULE | Freq: Every day | ORAL | 3 refills | Status: DC

## 2020-11-04 NOTE — Addendum Note (Signed)
Addended by: Wyvonne Lenz on: 11/04/2020 11:08 AM   Modules accepted: Orders

## 2020-11-04 NOTE — Addendum Note (Signed)
Addended by: Elmer Picker on: 11/04/2020 10:43 AM   Modules accepted: Orders

## 2020-11-04 NOTE — Progress Notes (Signed)
Subjective:    Patient ID: Gevena Lamb, female    DOB: [birth year], 55 y.o.   MRN: [id number]  HPI Here for a well exam. She feels great.    Review of Systems  Constitutional: Negative.  Negative for activity change, appetite change, diaphoresis, fatigue, fever and unexpected weight change.  HENT: Negative.  Negative for congestion, ear pain, hearing loss, nosebleeds, sore throat, tinnitus, trouble swallowing and voice change.   Eyes: Negative.  Negative for photophobia, pain, discharge, redness and visual disturbance.  Respiratory: Negative.  Negative for apnea, cough, choking, chest tightness, shortness of breath, wheezing and stridor.   Cardiovascular: Negative.  Negative for chest pain, palpitations and leg swelling.  Gastrointestinal: Negative.  Negative for abdominal distention, abdominal pain, blood in stool, constipation, diarrhea, nausea, rectal pain and vomiting.  Genitourinary: Negative.  Negative for difficulty urinating, dysuria, enuresis, flank pain, frequency, hematuria, menstrual problem, urgency, vaginal bleeding, vaginal discharge and vaginal pain.  Musculoskeletal: Negative.  Negative for arthralgias, back pain, gait problem, joint swelling, myalgias, neck pain and neck stiffness.  Skin: Negative.  Negative for color change, pallor, rash and wound.  Neurological: Negative.  Negative for dizziness, tremors, seizures, syncope, speech difficulty, weakness, light-headedness, numbness and headaches.  Hematological:  Negative for adenopathy. Does not bruise/bleed easily.  Psychiatric/Behavioral: Negative.  Negative for agitation, behavioral problems, confusion, dysphoric mood, hallucinations and sleep disturbance. The patient is not nervous/anxious.       Objective:   Physical Exam Constitutional:      General: She is not in acute distress.    Appearance: Normal appearance. She is well-developed. She is not diaphoretic.  HENT:     Head: Normocephalic and atraumatic.      Right Ear: External ear normal.     Left Ear: External ear normal.     Nose: Nose normal.     Mouth/Throat:     Pharynx: No oropharyngeal exudate.  Eyes:     General: No scleral icterus.       Right eye: No discharge.        Left eye: No discharge.     Conjunctiva/sclera: Conjunctivae normal.     Pupils: Pupils are equal, round, and reactive to light.  Neck:     Thyroid: No thyromegaly.     Vascular: No JVD.  Cardiovascular:     Rate and Rhythm: Normal rate and regular rhythm.     Heart sounds: Normal heart sounds. No murmur heard.   No friction rub. No gallop.  Pulmonary:     Effort: Pulmonary effort is normal. No respiratory distress.     Breath sounds: Normal breath sounds. No stridor. No wheezing or rales.  Chest:     Chest wall: No tenderness.  Abdominal:     General: Bowel sounds are normal. There is no distension or abdominal bruit.     Palpations: Abdomen is soft. Abdomen is not rigid. There is no mass.     Tenderness: There is no abdominal tenderness. There is no guarding or rebound.     Hernia: No hernia is present.  Genitourinary:    Vagina: Normal. No vaginal discharge, erythema, tenderness or bleeding.     Cervix: No cervical motion tenderness, discharge or friability.     Adnexa:        Right: No mass, tenderness or fullness.         Left: No mass, tenderness or fullness.       Rectum: Normal.  Musculoskeletal:  General: No tenderness. Normal range of motion.     Cervical back: Normal range of motion and neck supple.  Lymphadenopathy:     Cervical: No cervical adenopathy.  Skin:    General: Skin is warm and dry.     Coloration: Skin is not pale.     Findings: No erythema or rash.  Neurological:     Mental Status: She is alert.     Cranial Nerves: No cranial nerve deficit.     Motor: No abnormal muscle tone.     Coordination: Coordination normal.     Deep Tendon Reflexes: Reflexes are normal and symmetric.  Psychiatric:        Behavior: Behavior  normal.        Thought Content: Thought content normal.        Judgment: Judgment normal.          Assessment & Plan:  Well exam. We discussed diet and exercise. Get fasting labs. Alysia Penna, MD

## 2020-11-06 LAB — CYTOLOGY - PAP: Diagnosis: NEGATIVE

## 2020-11-11 ENCOUNTER — Other Ambulatory Visit: Payer: Self-pay | Admitting: Family Medicine

## 2020-11-11 DIAGNOSIS — Z1231 Encounter for screening mammogram for malignant neoplasm of breast: Secondary | ICD-10-CM

## 2020-11-18 ENCOUNTER — Inpatient Hospital Stay: Admission: RE | Admit: 2020-11-18 | Payer: BC Managed Care – PPO | Source: Ambulatory Visit

## 2020-11-18 DIAGNOSIS — Z1231 Encounter for screening mammogram for malignant neoplasm of breast: Secondary | ICD-10-CM

## 2020-12-06 ENCOUNTER — Other Ambulatory Visit: Payer: Self-pay | Admitting: Family Medicine

## 2021-03-26 ENCOUNTER — Other Ambulatory Visit: Payer: Self-pay | Admitting: Family Medicine

## 2021-04-04 ENCOUNTER — Encounter: Payer: Self-pay | Admitting: Family Medicine

## 2021-04-05 ENCOUNTER — Other Ambulatory Visit: Payer: Self-pay

## 2021-04-05 MED ORDER — ATORVASTATIN CALCIUM 10 MG PO TABS: 10.0000 mg | ORAL_TABLET | Freq: Every day | ORAL | 0 refills | Status: DC

## 2021-05-29 ENCOUNTER — Other Ambulatory Visit: Payer: Self-pay | Admitting: Family Medicine

## 2021-06-24 ENCOUNTER — Other Ambulatory Visit: Payer: Self-pay | Admitting: Family Medicine

## 2021-09-01 ENCOUNTER — Other Ambulatory Visit: Payer: Self-pay | Admitting: Family Medicine

## 2021-09-22 ENCOUNTER — Other Ambulatory Visit: Payer: Self-pay | Admitting: Family Medicine

## 2021-11-26 ENCOUNTER — Other Ambulatory Visit: Payer: Self-pay

## 2021-11-26 ENCOUNTER — Other Ambulatory Visit: Payer: Self-pay | Admitting: Family Medicine

## 2021-11-26 DIAGNOSIS — I10 Essential (primary) hypertension: Secondary | ICD-10-CM

## 2021-11-26 MED ORDER — METOPROLOL SUCCINATE ER 100 MG PO TB24: ORAL_TABLET | ORAL | 0 refills | Status: DC

## 2021-12-27 ENCOUNTER — Other Ambulatory Visit: Payer: Self-pay | Admitting: Family Medicine

## 2021-12-27 NOTE — Telephone Encounter (Signed)
Please have pt call the office for appointment before further refills

## 2022-01-20 ENCOUNTER — Other Ambulatory Visit: Payer: Self-pay | Admitting: Family Medicine

## 2022-01-21 ENCOUNTER — Encounter: Payer: Self-pay | Admitting: Family Medicine

## 2022-01-26 ENCOUNTER — Ambulatory Visit: Payer: BC Managed Care – PPO | Admitting: Family Medicine

## 2022-01-26 VITALS — BP 120/80 | HR 59 | Temp 97.5°F | Wt 178.0 lb

## 2022-01-26 DIAGNOSIS — Z23 Encounter for immunization: Secondary | ICD-10-CM | POA: Diagnosis not present

## 2022-01-26 DIAGNOSIS — F320 Major depressive disorder, single episode, mild: Secondary | ICD-10-CM | POA: Diagnosis not present

## 2022-01-26 DIAGNOSIS — I10 Essential (primary) hypertension: Secondary | ICD-10-CM | POA: Diagnosis not present

## 2022-01-26 DIAGNOSIS — E785 Hyperlipidemia, unspecified: Secondary | ICD-10-CM | POA: Diagnosis not present

## 2022-01-26 MED ORDER — ATORVASTATIN CALCIUM 10 MG PO TABS
10.0000 mg | ORAL_TABLET | Freq: Every day | ORAL | 3 refills | Status: DC
Start: 2022-01-26 — End: 2022-10-31

## 2022-01-26 MED ORDER — BUPROPION HCL ER (XL) 300 MG PO TB24: ORAL_TABLET | ORAL | 3 refills | Status: DC

## 2022-01-26 MED ORDER — HYDROCHLOROTHIAZIDE 25 MG PO TABS: ORAL_TABLET | ORAL | 3 refills | Status: DC

## 2022-01-26 MED ORDER — METOPROLOL SUCCINATE ER 100 MG PO TB24: ORAL_TABLET | ORAL | 3 refills | Status: DC

## 2022-01-26 MED ORDER — VENLAFAXINE HCL ER 75 MG PO CP24
ORAL_CAPSULE | ORAL | 3 refills | Status: DC
Start: 2022-01-26 — End: 2022-09-14

## 2022-01-26 NOTE — Progress Notes (Signed)
   Subjective:    Patient ID: Kathy Lamb, female    DOB: Aug 02, 1965, 56 y.o.   MRN: [id number]  HPI Here for medication refills. She feels great and has concerns.    Review of Systems  Constitutional: Negative.   Respiratory: Negative.    Cardiovascular: Negative.   Neurological: Negative.   Psychiatric/Behavioral: Negative.         Objective:   Physical Exam Constitutional:      Appearance: Normal appearance.  Cardiovascular:     Rate and Rhythm: Normal rate and regular rhythm.     Pulses: Normal pulses.     Heart sounds: Normal heart sounds.  Pulmonary:     Effort: Pulmonary effort is normal.     Breath sounds: Normal breath sounds.  Neurological:     General: No focal deficit present.     Mental Status: She is alert and oriented to person, place, and time.  Psychiatric:        Mood and Affect: Mood normal.           Assessment & Plan:  Her HTN and depression are stable. We refilled all her medications. She will return soon for a well exam and fasting labs. Alysia Penna, MD

## 2022-03-08 ENCOUNTER — Encounter: Payer: BC Managed Care – PPO | Admitting: Family Medicine

## 2022-04-20 ENCOUNTER — Encounter: Payer: BC Managed Care – PPO | Admitting: Family Medicine

## 2022-09-14 ENCOUNTER — Ambulatory Visit: Payer: BC Managed Care – PPO | Admitting: Family Medicine

## 2022-09-14 ENCOUNTER — Encounter: Payer: Self-pay | Admitting: Family Medicine

## 2022-09-14 VITALS — BP 110/78 | HR 66 | Temp 97.9°F | Wt 186.2 lb

## 2022-09-14 DIAGNOSIS — F32A Depression, unspecified: Secondary | ICD-10-CM

## 2022-09-14 DIAGNOSIS — R0602 Shortness of breath: Secondary | ICD-10-CM | POA: Diagnosis not present

## 2022-09-14 DIAGNOSIS — F419 Anxiety disorder, unspecified: Secondary | ICD-10-CM | POA: Diagnosis not present

## 2022-09-14 MED ORDER — VENLAFAXINE HCL ER 150 MG PO CP24: 150.0000 mg | ORAL_CAPSULE | Freq: Every day | ORAL | 3 refills | Status: DC

## 2022-09-14 NOTE — Progress Notes (Signed)
   Subjective:    Patient ID: Kathy Lamb, female    DOB: Oct 14, 1965, 57 y.o.   MRN: [id number]  HPI Here for several issues. First she thinks her depression and anxiety are not as well controlled as they used to be. She describes feeling tired all the time, even though she sleeps well at night. She has been irritable, and she has lost interest in some of the things she has always enjoyed. She admits to dealing with tremendous stress at work. 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 The other issue over the past month is she has occasional spells where she feels like she cannot catch her breath. This lasts a few seconds and then it passes. This only occur at rest and never during exertion. No chest pain or palpitations.    Review of Systems  Constitutional:  Positive for fatigue.  Respiratory:  Positive for shortness of breath. Negative for cough, choking and wheezing.   Cardiovascular: Negative.   Gastrointestinal: Negative.   Genitourinary: Negative.   Psychiatric/Behavioral:  Positive for decreased concentration and dysphoric mood. Negative for agitation, behavioral problems, confusion, hallucinations, self-injury, sleep disturbance and suicidal ideas. The patient is nervous/anxious.        Objective:   Physical Exam Constitutional:      General: She is not in acute distress.    Appearance: Normal appearance. She is normal weight.  Cardiovascular:     Rate and Rhythm: Normal rate and regular rhythm.     Pulses: Normal pulses.     Heart sounds: Normal heart sounds.  Pulmonary:     Effort: Pulmonary effort is normal.     Breath sounds: Normal breath sounds.  Neurological:     General: No focal deficit present.     Mental Status: She is alert and oriented to person, place, and time.  Psychiatric:        Behavior: Behavior normal.        Thought Content: Thought content normal.     Comments: Somewhat anxious             Assessment & Plan:  Her anxiety and depression have been more of a problem lately, so we will increase the Effexor XR to 150 mg daily. I also advised her to exercise 30 minutes at least 5 days a week. The SOB spells she described are almost certainly due to stress. These should resolve when we get the stress under control.  Gershon Crane, MD

## 2022-10-29 ENCOUNTER — Other Ambulatory Visit: Payer: Self-pay | Admitting: Family Medicine

## 2022-11-11 ENCOUNTER — Encounter: Payer: Self-pay | Admitting: Family Medicine

## 2022-11-16 ENCOUNTER — Ambulatory Visit: Payer: BC Managed Care – PPO | Admitting: Family Medicine

## 2022-11-16 NOTE — Telephone Encounter (Signed)
Yes we could drain this with a needle

## 2022-11-21 ENCOUNTER — Ambulatory Visit: Payer: BC Managed Care – PPO | Admitting: Family Medicine

## 2022-11-21 ENCOUNTER — Encounter: Payer: Self-pay | Admitting: Family Medicine

## 2022-11-21 VITALS — BP 126/86 | HR 66 | Temp 98.2°F | Wt 178.0 lb

## 2022-11-21 DIAGNOSIS — H6191 Disorder of right external ear, unspecified: Secondary | ICD-10-CM

## 2022-11-21 DIAGNOSIS — M6740 Ganglion, unspecified site: Secondary | ICD-10-CM

## 2022-11-21 NOTE — Progress Notes (Signed)
   Subjective:    Patient ID: Kathy Lamb, female    DOB: [birth year], 57 y.o.   MRN: [id number]  HPI Here for 2 issues. First she noticed a red scaly patch of skin on her right ear lobe one month ago. Then she noticed a round cyst on top of her right 3rd toe 2 weeks ago.    Review of Systems  Constitutional: Negative.   Respiratory: Negative.    Cardiovascular: Negative.        Objective:   Physical Exam Constitutional:      Appearance: Normal appearance.  Cardiovascular:     Rate and Rhythm: Normal rate and regular rhythm.     Pulses: Normal pulses.     Heart sounds: Normal heart sounds.  Pulmonary:     Effort: Pulmonary effort is normal.     Breath sounds: Normal breath sounds.  Musculoskeletal:     Comments: The dorsal right 3rd toe has a mucoid cyst on the dorsal PIP joint. This is not tender   Skin:    Comments: The right ear lobe has a 3 mm red ulcerated lesion with a raised pearly border  Neurological:     Mental Status: She is alert.           Assessment & Plan:  The lesion on the ear is either a basal cell or a squamous cancer. We will refer her to Dermatology to evaluate this. She also has a mucoid cyst on the right 3rd toe, and we will refer her to Podiatry to remove this.  Gershon Crane, MD

## 2022-11-23 ENCOUNTER — Encounter: Payer: Self-pay | Admitting: Family Medicine

## 2022-11-23 ENCOUNTER — Ambulatory Visit (INDEPENDENT_AMBULATORY_CARE_PROVIDER_SITE_OTHER): Payer: BC Managed Care – PPO | Admitting: Family Medicine

## 2022-11-23 VITALS — BP 110/78 | HR 61 | Temp 98.0°F | Ht 64.5 in | Wt 186.0 lb

## 2022-11-23 DIAGNOSIS — Z860100 Personal history of colon polyps, unspecified: Secondary | ICD-10-CM

## 2022-11-23 DIAGNOSIS — Z0000 Encounter for general adult medical examination without abnormal findings: Secondary | ICD-10-CM

## 2022-11-23 LAB — BASIC METABOLIC PANEL
BUN: 13 mg/dL (ref 6–23)
CO2: 33 mEq/L — ABNORMAL HIGH (ref 19–32)
Calcium: 9.8 mg/dL (ref 8.4–10.5)
Chloride: 99 mEq/L (ref 96–112)
Creatinine, Ser: 0.86 mg/dL (ref 0.40–1.20)
GFR: 74.1 mL/min (ref >60.00)
Glucose, Bld: 104 mg/dL — ABNORMAL HIGH (ref 70–99)
Potassium: 3.4 mEq/L — ABNORMAL LOW (ref 3.5–5.1)
Sodium: 140 mEq/L (ref 135–145)

## 2022-11-23 LAB — HEPATIC FUNCTION PANEL
ALT: 17 U/L (ref 0–35)
AST: 16 U/L (ref 0–37)
Albumin: 4.3 g/dL (ref 3.5–5.2)
Alkaline Phosphatase: 86 U/L (ref 39–117)
Bilirubin, Direct: 0.1 mg/dL (ref 0.0–0.3)
Total Bilirubin: 0.6 mg/dL (ref 0.2–1.2)
Total Protein: 7.1 g/dL (ref 6.0–8.3)

## 2022-11-23 LAB — CBC WITH DIFFERENTIAL/PLATELET
Basophils Absolute: 0 10*3/uL (ref 0.0–0.1)
Basophils Relative: 0.6 % (ref 0.0–3.0)
Eosinophils Absolute: 0.3 10*3/uL (ref 0.0–0.7)
Eosinophils Relative: 3.7 % (ref 0.0–5.0)
HCT: 39.1 % (ref 36.0–46.0)
Hemoglobin: 12.8 g/dL (ref 12.0–15.0)
Lymphocytes Relative: 27.9 % (ref 12.0–46.0)
Lymphs Abs: 2 10*3/uL (ref 0.7–4.0)
MCHC: 32.9 g/dL (ref 30.0–36.0)
MCV: 89.8 fl (ref 78.0–100.0)
Monocytes Absolute: 0.5 10*3/uL (ref 0.1–1.0)
Monocytes Relative: 7.2 % (ref 3.0–12.0)
Neutro Abs: 4.2 10*3/uL (ref 1.4–7.7)
Neutrophils Relative %: 60.6 % (ref 43.0–77.0)
Platelets: 208 10*3/uL (ref 150.0–400.0)
RBC: 4.35 Mil/uL (ref 3.87–5.11)
RDW: 13 % (ref 11.5–15.5)
WBC: 7 10*3/uL (ref 4.0–10.5)

## 2022-11-23 LAB — LIPID PANEL
Cholesterol: 146 mg/dL (ref 0–200)
HDL: 61.7 mg/dL (ref >39.00)
LDL Cholesterol: 72 mg/dL (ref 0–99)
NonHDL: 84.04
Total CHOL/HDL Ratio: 2
Triglycerides: 59 mg/dL (ref 0.0–149.0)
VLDL: 11.8 mg/dL (ref 0.0–40.0)

## 2022-11-23 LAB — TSH: TSH: 2.11 u[IU]/mL (ref 0.35–5.50)

## 2022-11-23 LAB — HEMOGLOBIN A1C: Hgb A1c MFr Bld: 5.8 % (ref 4.6–6.5)

## 2022-11-23 MED ORDER — NALTREXONE HCL 50 MG PO TABS: 50.0000 mg | ORAL_TABLET | Freq: Every day | ORAL | 2 refills | Status: DC

## 2022-11-23 NOTE — Progress Notes (Signed)
Subjective:    Patient ID: Kathy Lamb, female    DOB: 28-Apr-1966, 57 y.o.   MRN: [id number]  HPI Here for a well exam. She feels well in general, but she has a few questions. First she has had spider veins on her legs for years and they seem to be getting worse. They are not painful. Also she asks about trying Contrave to help lose weight. Her daughter has been using this and has been successful.    Review of Systems  Constitutional: Negative.   HENT: Negative.    Eyes: Negative.   Respiratory: Negative.    Cardiovascular: Negative.   Gastrointestinal: Negative.   Genitourinary:  Negative for decreased urine volume, difficulty urinating, dyspareunia, dysuria, enuresis, flank pain, frequency, hematuria, pelvic pain and urgency.  Musculoskeletal: Negative.   Skin: Negative.   Neurological: Negative.  Negative for headaches.  Psychiatric/Behavioral: Negative.         Objective:   Physical Exam Constitutional:      General: She is not in acute distress.    Appearance: Normal appearance. She is well-developed.  HENT:     Head: Normocephalic and atraumatic.     Right Ear: External ear normal.     Left Ear: External ear normal.     Nose: Nose normal.     Mouth/Throat:     Pharynx: No oropharyngeal exudate.  Eyes:     General: No scleral icterus.    Conjunctiva/sclera: Conjunctivae normal.     Pupils: Pupils are equal, round, and reactive to light.  Neck:     Thyroid: No thyromegaly.     Vascular: No JVD.  Cardiovascular:     Rate and Rhythm: Normal rate and regular rhythm.     Pulses: Normal pulses.     Heart sounds: Normal heart sounds. No murmur heard.    No friction rub. No gallop.  Pulmonary:     Effort: Pulmonary effort is normal. No respiratory distress.     Breath sounds: Normal breath sounds. No wheezing or rales.  Chest:     Chest wall: No tenderness.  Abdominal:     General: Bowel sounds are normal. There is no distension.     Palpations: Abdomen is  soft. There is no mass.     Tenderness: There is no abdominal tenderness. There is no guarding or rebound.  Musculoskeletal:        General: No tenderness. Normal range of motion.     Cervical back: Normal range of motion and neck supple.  Lymphadenopathy:     Cervical: No cervical adenopathy.  Skin:    General: Skin is warm and dry.     Findings: No erythema or rash.     Comments: Numerous spider veins on the legs   Neurological:     General: No focal deficit present.     Mental Status: She is alert and oriented to person, place, and time.     Cranial Nerves: No cranial nerve deficit.     Motor: No abnormal muscle tone.     Coordination: Coordination normal.     Deep Tendon Reflexes: Reflexes are normal and symmetric. Reflexes normal.  Psychiatric:        Mood and Affect: Mood normal.        Behavior: Behavior normal.        Thought Content: Thought content normal.        Judgment: Judgment normal.           Assessment & Plan:  Well exam. We discussed diet and exercise. Get fasting labs. She is already seeing the Dermatologist for a skin lesion on the right ear, and she will ask them about treating the spider veins when she sees them. She is due for a colonoscopy so we will have the GI office contact her. She is already taking Bupropion so we will add Naltrexone 50 mg daily to this (to effectively act like Contrave). Follow up in one month. Gershon Crane, MD

## 2022-11-29 ENCOUNTER — Ambulatory Visit (HOSPITAL_BASED_OUTPATIENT_CLINIC_OR_DEPARTMENT_OTHER)
Admission: RE | Admit: 2022-11-29 | Discharge: 2022-11-29 | Disposition: A | Discharge status: Home / Self Care | Payer: BC Managed Care – PPO | Source: Ambulatory Visit | Attending: Diagnostic Radiology | Admitting: Diagnostic Radiology

## 2022-11-29 ENCOUNTER — Other Ambulatory Visit (HOSPITAL_BASED_OUTPATIENT_CLINIC_OR_DEPARTMENT_OTHER): Payer: Self-pay | Admitting: Family Medicine

## 2022-11-29 DIAGNOSIS — R921 Mammographic calcification found on diagnostic imaging of breast: Secondary | ICD-10-CM | POA: Diagnosis not present

## 2022-11-29 DIAGNOSIS — Z1231 Encounter for screening mammogram for malignant neoplasm of breast: Secondary | ICD-10-CM

## 2022-12-01 ENCOUNTER — Other Ambulatory Visit: Payer: Self-pay | Admitting: Family Medicine

## 2022-12-01 DIAGNOSIS — R928 Other abnormal and inconclusive findings on diagnostic imaging of breast: Secondary | ICD-10-CM

## 2022-12-06 ENCOUNTER — Ambulatory Visit (INDEPENDENT_AMBULATORY_CARE_PROVIDER_SITE_OTHER): Payer: BC Managed Care – PPO

## 2022-12-06 ENCOUNTER — Ambulatory Visit: Payer: BC Managed Care – PPO | Admitting: Podiatry

## 2022-12-06 ENCOUNTER — Encounter: Payer: Self-pay | Admitting: Podiatry

## 2022-12-06 VITALS — BP 137/90

## 2022-12-06 DIAGNOSIS — M67479 Ganglion, unspecified ankle and foot: Secondary | ICD-10-CM | POA: Diagnosis not present

## 2022-12-06 DIAGNOSIS — M79672 Pain in left foot: Secondary | ICD-10-CM | POA: Diagnosis not present

## 2022-12-06 NOTE — Progress Notes (Signed)
  Subjective:  Patient ID: Kathy Lamb, female    DOB: [birth year],   MRN: [id number]  Chief Complaint  Patient presents with   bump on left 3rd toe    Appearance of a blister but hard to the touch, has been there for about 2 months,denies any pain in the toe but sensitive w/ touch    57 y.o. female presents for concern of cyst on the top of her right third toe. It has been present for several months. Relates it can be sensitive to the touch but overall denies much pain. . Denies any other pedal complaints. Denies n/v/f/c.   Past Medical History:  Diagnosis Date   Allergy    Anxiety    Blindness of right eye    congenital   Depression    Family history of anesthesia complication    n/v   Gallstones    Headache(784.0)    History of gestational diabetes    Hypertension    Migraine headache    Nystagmus     Objective:  Physical Exam: Vascular: DP/PT pulses 2/4 bilateral. CFT <3 seconds. Normal hair growth on digits. No edema.  Skin. No lacerations or abrasions bilateral feet. Right third digit dorsally there is a firm but fluctant skin lesion noted above the distal IPJ. Some tenderness to touch Musculoskeletal: MMT 5/5 bilateral lower extremities in DF, PF, Inversion and Eversion. Deceased ROM in DF of ankle joint.  Neurological: Sensation intact to light touch.   Assessment:   1. Digital mucous cyst of toe      Plan:  Patient was evaluated and treated and all questions answered. X-rays reviewed and discussed with patient. No acute fractures or dislocations noted.  Discussed ganglion cysts and treatment options with the patient. Patient elected to go aspiration of the cyst today.  Procedure note below. Advised patient on postprocedure protocol. Patient to follow-up in 2 weeks or sooner if symptoms worsen or fail to improve.    Procedure: Aspiration cyst, right third digit  Discussed alternatives, risks, complications and verbal consent was obtained.  Location: right  third digit  Skin Prep: Alcohol. Injectate: 3 cc 1 % lidocaine.  Aspirated cyst with 18 gauge needle, about 2 cc of gel like viscous fluid aspirated consistent with ganglion cyst Disposition: Patient tolerated procedure well. Injection site dressed with a band-aid. Compression bandage applied.  Post-procedure care was discussed and return precautions discussed.    Louann Sjogren, DPM

## 2022-12-09 ENCOUNTER — Ambulatory Visit
Admission: RE | Admit: 2022-12-09 | Discharge: 2022-12-09 | Disposition: A | Discharge status: Home / Self Care | Payer: BC Managed Care – PPO | Source: Ambulatory Visit | Attending: Family Medicine | Admitting: Family Medicine

## 2022-12-09 ENCOUNTER — Encounter: Payer: Self-pay | Admitting: Family Medicine

## 2022-12-09 DIAGNOSIS — R928 Other abnormal and inconclusive findings on diagnostic imaging of breast: Secondary | ICD-10-CM

## 2022-12-12 ENCOUNTER — Other Ambulatory Visit: Payer: Self-pay | Admitting: Family Medicine

## 2022-12-12 ENCOUNTER — Encounter: Payer: Self-pay | Admitting: Family Medicine

## 2022-12-12 DIAGNOSIS — R921 Mammographic calcification found on diagnostic imaging of breast: Secondary | ICD-10-CM

## 2022-12-12 DIAGNOSIS — N6310 Unspecified lump in the right breast, unspecified quadrant: Secondary | ICD-10-CM

## 2022-12-12 DIAGNOSIS — N6489 Other specified disorders of breast: Secondary | ICD-10-CM

## 2022-12-14 ENCOUNTER — Ambulatory Visit (INDEPENDENT_AMBULATORY_CARE_PROVIDER_SITE_OTHER): Payer: BC Managed Care – PPO | Admitting: Podiatry

## 2022-12-14 ENCOUNTER — Encounter: Payer: Self-pay | Admitting: Podiatry

## 2022-12-14 DIAGNOSIS — M67479 Ganglion, unspecified ankle and foot: Secondary | ICD-10-CM

## 2022-12-14 NOTE — Progress Notes (Signed)
  Subjective:  Patient ID: Kathy Lamb, female    DOB: 1966/05/18,   MRN: [id number]  Chief Complaint  Patient presents with   Cyst    Pt came in today to be seen for a cyst on the right toe.     57 y.o. female presents for follow-up of  cyst on the top of her right third toe. Relates doing well. Seems a bit puffy and not sure if coming back. . Denies any other pedal complaints. Denies n/v/f/c.   Past Medical History:  Diagnosis Date   Allergy    Anxiety    Blindness of right eye    congenital   Depression    Family history of anesthesia complication    n/v   Gallstones    Headache(784.0)    History of gestational diabetes    Hypertension    Migraine headache    Nystagmus     Objective:  Physical Exam: Vascular: DP/PT pulses 2/4 bilateral. CFT <3 seconds. Normal hair growth on digits. No edema.  Skin. No lacerations or abrasions bilateral feet. Right third digit dorsally skin still loose but no fluid filled in the lesion. No pain to palpation  Musculoskeletal: MMT 5/5 bilateral lower extremities in DF, PF, Inversion and Eversion. Deceased ROM in DF of ankle joint.  Neurological: Sensation intact to light touch.   Assessment:   1. Digital mucous cyst of toe       Plan:  Patient was evaluated and treated and all questions answered. Toe was evaluated and appears to be healing well.  Continue compression dressing for another couple weeks.  Patient to follow-up as needed.     Louann Sjogren, DPM

## 2022-12-30 ENCOUNTER — Encounter: Payer: Self-pay | Admitting: Family Medicine

## 2023-01-02 MED ORDER — NALTREXONE HCL 50 MG PO TABS: 100.0000 mg | ORAL_TABLET | Freq: Every day | ORAL | 5 refills | Status: DC

## 2023-01-02 NOTE — Telephone Encounter (Signed)
I sent in for her to take this BID

## 2023-02-02 ENCOUNTER — Encounter: Payer: Self-pay | Admitting: Podiatry

## 2023-02-02 ENCOUNTER — Encounter: Payer: Self-pay | Admitting: Family Medicine

## 2023-02-20 ENCOUNTER — Other Ambulatory Visit: Payer: Self-pay | Admitting: Family Medicine

## 2023-02-20 DIAGNOSIS — I10 Essential (primary) hypertension: Secondary | ICD-10-CM

## 2023-05-14 ENCOUNTER — Ambulatory Visit
Admission: EM | Admit: 2023-05-14 | Discharge: 2023-05-14 | Disposition: A | Discharge status: Home / Self Care | Payer: BC Managed Care – PPO | Attending: Family Medicine | Admitting: Family Medicine

## 2023-05-14 ENCOUNTER — Other Ambulatory Visit: Payer: Self-pay

## 2023-05-14 DIAGNOSIS — U071 COVID-19: Secondary | ICD-10-CM | POA: Diagnosis not present

## 2023-05-14 LAB — POC SARS CORONAVIRUS 2 AG -  ED: SARS Coronavirus 2 Ag: POSITIVE — AB

## 2023-05-14 LAB — POCT INFLUENZA A/B
Influenza A, POC: NEGATIVE
Influenza B, POC: NEGATIVE

## 2023-05-14 MED ORDER — PAXLOVID (300/100) 20 X 150 MG & 10 X 100MG PO TBPK: 3.0000 | ORAL_TABLET | Freq: Two times a day (BID) | ORAL | 0 refills | Status: AC

## 2023-05-14 NOTE — ED Triage Notes (Signed)
Ill since Friday, has had cough, sore throat, head congestion, rhinorrhea, body aches. Yellow mucus from nose yesterday but today is clear. Has taken tylenol, flonase.

## 2023-05-14 NOTE — Discharge Instructions (Addendum)
Drink lots of fluids May take Tylenol or ibuprofen for pain and fever May take over-the-counter cough or cold medicines as needed Take the Paxlovid 2 times a day for 5 days.  Take with food Call for problems

## 2023-05-14 NOTE — ED Provider Notes (Signed)
[facility]    CSN: [id number] Arrival date & time: 05/14/23  1131      History   Chief Complaint No chief complaint on file.   HPI Kathy Lamb is a 57 y.o. female.   Patient started feeling ill on Friday.  Today is day 2 of her illness.  She has fatigue, congestion, postnasal drip, cough.  She states that she has bodyaches.  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    Past Medical History:  Diagnosis Date   Allergy    Anxiety    Blindness of right eye    congenital   Depression    Family history of anesthesia complication    n/v   Gallstones    Headache(784.0)    History of gestational diabetes    Hypertension    Migraine headache    Nystagmus     Patient Active Problem List   Diagnosis Date Noted   Anxiety and depression 09/14/2022   Dyslipidemia 01/26/2022   Achilles tendinosis 06/22/2017   HTN (hypertension) 10/14/2015   Nystagmus    GESTATIONAL DIABETES 05/27/2010   BACK PAIN, THORACIC REGION 02/27/2009   Migraine headache 06/08/2007   NECK PAIN 06/08/2007   HEADACHE 06/08/2007    Past Surgical History:  Procedure Laterality Date   CHOLECYSTECTOMY N/A 02/07/2013   Procedure: LAPAROSCOPIC CHOLECYSTECTOMY WITH INTRAOPERATIVE CHOLANGIOGRAM;  Surgeon: Robyne Askew, MD;  Location: [facility] OR;  Service: General;  Laterality: N/A;   COLONOSCOPY  12/01/2017   per Dr. Christella Hartigan, benign polyps, repeat in 5 yrs     CYST REMOVAL LEG Left 1995   ELBOW SURGERY Left 1974    OB History     Gravida  1   Para  1   Term      Preterm      AB      Living  1      SAB      IAB      Ectopic      Multiple      Live Births               Home Medications    Prior to Admission medications   Medication Sig Start Date End Date Taking? Authorizing Provider  nirmatrelvir/ritonavir (PAXLOVID, 300/100,) 20 x 150 MG & 10 x 100MG  TBPK Take 3 tablets by mouth 2 (two) times daily for 5 days. Patient GFR is 75. Take nirmatrelvir (150  mg) two tablets twice daily for 5 days and ritonavir (100 mg) one tablet twice daily for 5 days. 05/14/23 05/19/23 Yes Eustace Moore, MD  atorvastatin (LIPITOR) 10 MG tablet TAKE 1 TABLET(10 MG) BY MOUTH DAILY 10/31/22   Nelwyn Salisbury, MD  buPROPion (WELLBUTRIN XL) 300 MG 24 hr tablet TAKE 1 TABLET(300 MG) BY MOUTH EVERY MORNING 10/31/22   Nelwyn Salisbury, MD  hydrochlorothiazide (HYDRODIURIL) 25 MG tablet TAKE 1 TABLET(25 MG) BY MOUTH DAILY 10/31/22   Nelwyn Salisbury, MD  metoprolol succinate (TOPROL-XL) 100 MG 24 hr tablet TAKE 1 TABLET BY MOUTH EVERY DAY WITH OR IMMEDIATELY FOLLOWING A MEAL 02/21/23   Nelwyn Salisbury, MD  venlafaxine XR (EFFEXOR XR) 150 MG 24 hr capsule Take 1 capsule (150 mg total) by mouth daily with breakfast. 09/14/22   Nelwyn Salisbury, MD    Family History Family History  Problem Relation Age of Onset   Diabetes Maternal Grandmother    Colon cancer Maternal Grandmother  Lung cancer Father    Pancreatic cancer Maternal Grandfather        grandfather   Arthritis Mother    Osteoporosis Mother    Rheum arthritis Mother    Heart disease Mother    Hypertension Other    Esophageal cancer Neg Hx    Rectal cancer Neg Hx    Stomach cancer Neg Hx    Liver cancer Neg Hx     Social History Social History   Tobacco Use   Smoking status: Never   Smokeless tobacco: Never  Vaping Use   Vaping status: Never Used  Substance Use Topics   Alcohol use: Yes    Alcohol/week: 0.0 standard drinks of alcohol    Comment: occasional wine   Drug use: No     Allergies   Lisinopril, Losartan, Norco [hydrocodone-acetaminophen], and Oxycodone   Review of Systems Review of Systems See HPI  Physical Exam Triage Vital Signs ED Triage Vitals  Encounter Vitals Group     BP 05/14/23 1140 (!) 148/86     Systolic BP Percentile --      Diastolic BP Percentile --      Pulse Rate 05/14/23 1140 80     Resp 05/14/23 1140 16     Temp 05/14/23 1140 98.3 F (36.8 C)     Temp  Source 05/14/23 1140 Oral     SpO2 05/14/23 1140 99 %     Weight --      Height --      Head Circumference --      Peak Flow --      Pain Score 05/14/23 1143 6     Pain Loc --      Pain Education --      Exclude from Growth Chart --    No data found.  Updated Vital Signs BP (!) 148/86   Pulse 80   Temp 98.3 F (36.8 C) (Oral)   Resp 16   LMP 02/28/2014   SpO2 99%      Physical Exam Constitutional:      General: She is not in acute distress.    Appearance: She is well-developed. She is ill-appearing.  HENT:     Head: Normocephalic and atraumatic.     Mouth/Throat:     Mouth: Mucous membranes are moist.  Eyes:     Conjunctiva/sclera: Conjunctivae normal.     Pupils: Pupils are equal, round, and reactive to light.  Cardiovascular:     Rate and Rhythm: Normal rate and regular rhythm.     Heart sounds: Normal heart sounds.  Pulmonary:     Effort: Pulmonary effort is normal. No respiratory distress.     Breath sounds: Normal breath sounds.  Abdominal:     General: There is no distension.     Palpations: Abdomen is soft.  Musculoskeletal:        General: Normal range of motion.     Cervical back: Normal range of motion.  Skin:    General: Skin is warm and dry.  Neurological:     Mental Status: She is alert.      UC Treatments / Results  Labs (all labs ordered are listed, but only abnormal results are displayed) Labs Reviewed  POC SARS CORONAVIRUS 2 AG -  ED - Abnormal; Notable for the following components:      Result Value   SARS Coronavirus 2 Ag Positive (*)    All other components within normal limits  POCT INFLUENZA A/B  EKG   Radiology No results found.  Procedures Procedures (including critical care time)  Medications Ordered in UC Medications - No data to display  Initial Impression / Assessment and Plan / UC Course  I have reviewed the triage vital signs and the nursing notes.  Pertinent labs & imaging results that were available  during my care of the patient were reviewed by me and considered in my medical decision making (see chart for details).     Discussed COVID.  Treatment at home.  Quarantine.  Masks.  Risks and benefits to Paxlovid.  Patient desires treatment Final Clinical Impressions(s) / UC Diagnoses   Final diagnoses:  COVID-19     Discharge Instructions      Drink lots of fluids May take Tylenol or ibuprofen for pain and fever May take over-the-counter cough or cold medicines as needed Take the Paxlovid 2 times a day for 5 days.  Take with food Call for problems   ED Prescriptions     Medication Sig Dispense Auth. Provider   nirmatrelvir/ritonavir (PAXLOVID, 300/100,) 20 x 150 MG & 10 x 100MG  TBPK Take 3 tablets by mouth 2 (two) times daily for 5 days. Patient GFR is 75. Take nirmatrelvir (150 mg) two tablets twice daily for 5 days and ritonavir (100 mg) one tablet twice daily for 5 days. 30 tablet Eustace Moore, MD      PDMP not reviewed this encounter.   Eustace Moore, MD 05/14/23 [phone]

## 2023-06-21 ENCOUNTER — Other Ambulatory Visit: Payer: BC Managed Care – PPO

## 2023-07-12 ENCOUNTER — Other Ambulatory Visit: Payer: BC Managed Care – PPO

## 2023-07-17 ENCOUNTER — Ambulatory Visit: Payer: 59 | Admitting: Family Medicine

## 2023-07-17 ENCOUNTER — Ambulatory Visit: Payer: Self-pay | Admitting: Family Medicine

## 2023-07-17 ENCOUNTER — Encounter: Payer: Self-pay | Admitting: Family Medicine

## 2023-07-17 VITALS — BP 120/80 | HR 67 | Temp 98.0°F | Ht 64.4 in | Wt 174.0 lb

## 2023-07-17 DIAGNOSIS — R0982 Postnasal drip: Secondary | ICD-10-CM | POA: Diagnosis not present

## 2023-07-17 DIAGNOSIS — R051 Acute cough: Secondary | ICD-10-CM | POA: Diagnosis not present

## 2023-07-17 DIAGNOSIS — J0140 Acute pansinusitis, unspecified: Secondary | ICD-10-CM | POA: Diagnosis not present

## 2023-07-17 MED ORDER — AMOXICILLIN-POT CLAVULANATE 500-125 MG PO TABS
1.0000 | ORAL_TABLET | Freq: Two times a day (BID) | ORAL | 0 refills | Status: AC
Start: 2023-07-17 — End: 2023-07-24

## 2023-07-17 NOTE — Telephone Encounter (Signed)
 Copied from CRM [id number]. Topic: Clinical - Medication Question >> Jul 17, 2023  9:34 AM [place] J wrote: Reason for CRM: Patient has been experiencing a plethora of sinus issues: Cough, headache, sinus pressure, constant nasal discharge. She would like to know if Dr. Clent Ridges could send in [facility]. Patient is not able to set an appointment.   Chief Complaint: Sinus problem Symptoms: Sinus pain and congestion Frequency: Ongoing for 1 week Pertinent Negatives: Patient denies fever, SOB Disposition: [] ED /[] Urgent Care (no appt availability in office) / [x] Appointment(In office/virtual)/ []  [facility]/ [] Home Care/ [] Refused Recommended Disposition /[] [facility]/ []  Follow-up with PCP Additional Notes: Patient stated that she is having sinus pain, congestion, cough, sore throat. Symptoms started one week ago. Patient using Nasal spray and Robitussin but there is no improvement. Same day appointment scheduled.  Reason for Disposition  Lots of coughing  Answer Assessment - Initial Assessment Questions 1. LOCATION: "Where does it hurt?"      Head and face is sore  2. ONSET: "When did the sinus pain start?"  (e.g., hours, days)      One week ago  3. SEVERITY: "How bad is the pain?"   (Scale 1-10; mild, moderate or severe)   - MILD (1-3): doesn't interfere with normal activities    - MODERATE (4-7): interferes with normal activities (e.g., work or school) or awakens from sleep   - SEVERE (8-10): excruciating pain and patient unable to do any normal activities        6/10   4. NASAL CONGESTION: "Is the nose blocked?" If Yes, ask: "Can you open it or must you breathe through your mouth?"     Stuffy nose and also having nasal discharge  5. FEVER: "Do you have a fever?" If Yes, ask: "What is it, how was it measured, and when did it start?"      No  6. OTHER SYMPTOMS: "Do you have any other symptoms?" (e.g., sore throat, cough, earache, difficulty breathing)     A  little throat discomfort, cough, chest tight when inhaling  Protocols used: Sinus Pain or Congestion-A-AH

## 2023-07-17 NOTE — Progress Notes (Signed)
 Established Patient Office Visit   Subjective  Patient ID: Kathy Lamb, female    DOB: January 29, 1966  Age: 58 y.o. MRN: [id number]  Chief Complaint  Patient presents with   Cough    Cough and congestion started a week ago, headache sinus pressure    Patient is a 58 year old female followed by Dr. Clent Ridges and seen for acute concern.  Pt with cough, headache, rhinorrhea, nasal congestion, and sinus pressure x 1 wk. Tried Tylenol Extra Strength, Robitussin, and a sinus medication without improvement in symptoms.    Patient Active Problem List   Diagnosis Date Noted   Anxiety and depression 09/14/2022   Dyslipidemia 01/26/2022   Achilles tendinosis 06/22/2017   HTN (hypertension) 10/14/2015   Nystagmus    GESTATIONAL DIABETES 05/27/2010   BACK PAIN, THORACIC REGION 02/27/2009   Migraine headache 06/08/2007   NECK PAIN 06/08/2007   HEADACHE 06/08/2007   Past Medical History:  Diagnosis Date   Allergy    Anxiety    Blindness of right eye    congenital   Depression    Family history of anesthesia complication    n/v   Gallstones    Headache(784.0)    History of gestational diabetes    Hypertension    Migraine headache    Nystagmus    Past Surgical History:  Procedure Laterality Date   CHOLECYSTECTOMY N/A 02/07/2013   Procedure: LAPAROSCOPIC CHOLECYSTECTOMY WITH INTRAOPERATIVE CHOLANGIOGRAM;  Surgeon: Robyne Askew, MD;  Location: [facility] OR;  Service: General;  Laterality: N/A;   COLONOSCOPY  12/01/2017   per Dr. Christella Hartigan, benign polyps, repeat in 5 yrs     CYST REMOVAL LEG Left 1995   ELBOW SURGERY Left 1974   Social History   Tobacco Use   Smoking status: Never   Smokeless tobacco: Never  Vaping Use   Vaping status: Never Used  Substance Use Topics   Alcohol use: Yes    Alcohol/week: 0.0 standard drinks of alcohol    Comment: occasional wine   Drug use: No   Family History  Problem Relation Age of Onset   Diabetes Maternal Grandmother    Colon cancer Maternal  Grandmother    Lung cancer Father    Pancreatic cancer Maternal Grandfather        grandfather   Arthritis Mother    Osteoporosis Mother    Rheum arthritis Mother    Heart disease Mother    Hypertension Other    Esophageal cancer Neg Hx    Rectal cancer Neg Hx    Stomach cancer Neg Hx    Liver cancer Neg Hx    Allergies  Allergen Reactions   Lisinopril Cough   Losartan Cough   Norco [Hydrocodone-Acetaminophen]     Severe nausea   Oxycodone       ROS Negative unless stated above    Objective:     BP 120/80 (BP Location: Left Arm, Patient Position: Sitting, Cuff Size: Normal)   Pulse 67   Temp 98 F (36.7 C) (Oral)   Ht 5' 4.4" (1.636 m)   Wt 174 lb (78.9 kg)   LMP 02/28/2014   SpO2 96%   BMI 29.50 kg/m  BP Readings from Last 3 Encounters:  07/17/23 120/80  05/14/23 (!) 148/86  12/06/22 (!) 137/90   Wt Readings from Last 3 Encounters:  07/17/23 174 lb (78.9 kg)  11/23/22 186 lb (84.4 kg)  11/21/22 178 lb (80.7 kg)     Physical Exam Constitutional:  General: She is not in acute distress.    Appearance: Normal appearance.  HENT:     Head: Normocephalic and atraumatic.     Right Ear: Tympanic membrane and external ear normal.     Left Ear: Tympanic membrane and external ear normal.     Nose:     Right Sinus: Maxillary sinus tenderness and frontal sinus tenderness present.     Left Sinus: Maxillary sinus tenderness present. No frontal sinus tenderness.     Mouth/Throat:     Mouth: Mucous membranes are moist.  Cardiovascular:     Rate and Rhythm: Normal rate and regular rhythm.     Heart sounds: Normal heart sounds. No murmur heard.    No gallop.  Pulmonary:     Effort: Pulmonary effort is normal. No respiratory distress.     Breath sounds: Normal breath sounds. No wheezing, rhonchi or rales.  Musculoskeletal:     Cervical back: No tenderness.  Lymphadenopathy:     Cervical: No cervical adenopathy.  Skin:    General: Skin is warm and dry.   Neurological:     Mental Status: She is alert and oriented to person, place, and time.      No results found for any visits on 07/17/23.    Assessment & Plan:  Acute pansinusitis, recurrence not specified -     Amoxicillin-Pot Clavulanate; Take 1 tablet by mouth in the morning and at bedtime for 7 days.  Dispense: 14 tablet; Refill: 0  Acute cough  Post-nasal drainage  Patient with URI symptoms x 1 week with increasing sinus pressure/facial pain.  Start ABX for sinusitis.  Okay to use OTC Flonase as well as cough/cold medicines.  Continue rest, hydration, antihistamine for symptoms.  Given precautions.  Return if symptoms worsen or fail to improve.   Deeann Saint, MD

## 2023-07-20 NOTE — Telephone Encounter (Signed)
 She was seen OV on 07-17-23.

## 2023-07-21 ENCOUNTER — Ambulatory Visit: Payer: BC Managed Care – PPO

## 2023-07-21 ENCOUNTER — Ambulatory Visit
Admission: RE | Admit: 2023-07-21 | Discharge: 2023-07-21 | Disposition: A | Discharge status: Home / Self Care | Payer: 59 | Source: Ambulatory Visit | Attending: Family Medicine | Admitting: Family Medicine

## 2023-07-21 DIAGNOSIS — N6310 Unspecified lump in the right breast, unspecified quadrant: Secondary | ICD-10-CM

## 2023-07-21 DIAGNOSIS — R921 Mammographic calcification found on diagnostic imaging of breast: Secondary | ICD-10-CM

## 2023-09-20 ENCOUNTER — Other Ambulatory Visit: Payer: Self-pay | Admitting: Family Medicine

## 2023-11-19 ENCOUNTER — Other Ambulatory Visit: Payer: Self-pay | Admitting: Family Medicine

## 2023-11-24 ENCOUNTER — Other Ambulatory Visit: Payer: Self-pay | Admitting: Family Medicine

## 2023-11-24 DIAGNOSIS — I10 Essential (primary) hypertension: Secondary | ICD-10-CM

## 2023-11-30 ENCOUNTER — Ambulatory Visit: Payer: Self-pay | Admitting: Podiatry

## 2023-12-04 ENCOUNTER — Encounter (INDEPENDENT_AMBULATORY_CARE_PROVIDER_SITE_OTHER): Payer: Self-pay

## 2024-03-05 ENCOUNTER — Other Ambulatory Visit: Payer: Self-pay | Admitting: Family Medicine

## 2024-03-05 DIAGNOSIS — I10 Essential (primary) hypertension: Secondary | ICD-10-CM

## 2024-06-05 ENCOUNTER — Other Ambulatory Visit: Payer: Self-pay | Admitting: Family Medicine

## 2024-06-05 DIAGNOSIS — I10 Essential (primary) hypertension: Secondary | ICD-10-CM
